# Patient Record
Sex: Female | Born: 1990 | Race: Black or African American | Hispanic: No | Marital: Single | Smoking: Never smoker
Health system: Southern US, Community
[De-identification: ages and names within clinical notes are randomized; demographics above are authoritative.]

## PROBLEM LIST (undated history)

## (undated) DIAGNOSIS — R519 Headache, unspecified: Secondary | ICD-10-CM

## (undated) DIAGNOSIS — D509 Iron deficiency anemia, unspecified: Secondary | ICD-10-CM

## (undated) DIAGNOSIS — I10 Essential (primary) hypertension: Secondary | ICD-10-CM

## (undated) HISTORY — PX: NO PAST SURGERIES: SHX2092

## (undated) HISTORY — DX: Headache, unspecified: R51.9

---

## 2016-09-19 ENCOUNTER — Ambulatory Visit
Admission: EM | Admit: 2016-09-19 | Discharge: 2016-09-19 | Disposition: A | Discharge status: Home / Self Care | Payer: Medicaid Other | Attending: Family Medicine | Admitting: Family Medicine

## 2016-09-19 ENCOUNTER — Ambulatory Visit
Admission: RE | Admit: 2016-09-19 | Discharge: 2016-09-19 | Disposition: A | Discharge status: Home / Self Care | Payer: Medicaid Other | Source: Ambulatory Visit | Attending: Family Medicine | Admitting: Family Medicine

## 2016-09-19 DIAGNOSIS — N898 Other specified noninflammatory disorders of vagina: Secondary | ICD-10-CM | POA: Diagnosis not present

## 2016-09-19 DIAGNOSIS — N760 Acute vaginitis: Secondary | ICD-10-CM | POA: Insufficient documentation

## 2016-09-19 DIAGNOSIS — R109 Unspecified abdominal pain: Secondary | ICD-10-CM | POA: Diagnosis present

## 2016-09-19 DIAGNOSIS — Z3201 Encounter for pregnancy test, result positive: Secondary | ICD-10-CM | POA: Diagnosis present

## 2016-09-19 DIAGNOSIS — B373 Candidiasis of vulva and vagina: Secondary | ICD-10-CM | POA: Insufficient documentation

## 2016-09-19 DIAGNOSIS — R102 Pelvic and perineal pain: Secondary | ICD-10-CM | POA: Insufficient documentation

## 2016-09-19 DIAGNOSIS — N739 Female pelvic inflammatory disease, unspecified: Secondary | ICD-10-CM | POA: Diagnosis not present

## 2016-09-19 DIAGNOSIS — B3731 Acute candidiasis of vulva and vagina: Secondary | ICD-10-CM

## 2016-09-19 LAB — CHLAMYDIA/NGC RT PCR (ARMC ONLY)
Chlamydia Tr: NOT DETECTED
N gonorrhoeae: NOT DETECTED

## 2016-09-19 LAB — WET PREP, GENITAL
Clue Cells Wet Prep HPF POC: NONE SEEN
Sperm: NONE SEEN
Trich, Wet Prep: NONE SEEN

## 2016-09-19 LAB — PREGNANCY, URINE: Preg Test, Ur: POSITIVE — AB

## 2016-09-19 MED ORDER — CEFTRIAXONE SODIUM 1 G IJ SOLR
1.0000 g | Freq: Once | INTRAMUSCULAR | Status: AC
  Administered 2016-09-19: 1 g via INTRAMUSCULAR

## 2016-09-19 MED ORDER — TERCONAZOLE 80 MG VA SUPP: 80.0000 mg | Freq: Every day | VAGINAL | 0 refills | Status: DC

## 2016-09-19 MED ORDER — FLUCONAZOLE 150 MG PO TABS: 150.0000 mg | ORAL_TABLET | Freq: Once | ORAL | 0 refills | Status: DC

## 2016-09-19 MED ORDER — METRONIDAZOLE 500 MG PO TABS: 500.0000 mg | ORAL_TABLET | Freq: Two times a day (BID) | ORAL | 0 refills | Status: DC

## 2016-09-19 MED ORDER — DOXYCYCLINE HYCLATE 100 MG PO CAPS: 100.0000 mg | ORAL_CAPSULE | Freq: Two times a day (BID) | ORAL | 0 refills | Status: DC

## 2016-09-19 NOTE — ED Provider Notes (Addendum)
[facility]    CSN: [id number] Arrival date & time: 09/19/16  [id number]     History   Chief Complaint Chief Complaint  Patient presents with  . Vaginitis    HPI Dawn Skinner is a 26 y.o. female.   Patient is a 26 year old black female with a history discharge. She states everything started Saturday. Start having him be discharged irritation of the vaginal area. She denies E dyspareunia per se but does report irritation which did have sexual relations last time. She states she did not discharged after she had encounter. She's recently had her Implanon removed. No known drug allergies no past surgeries she does not smoke no pertinent past medical history relevant to today's visit both her and her family. She has a yeast infections before was assured the oral medication taken that seemed to make the difference. She's had no pelvic infections or problems with pelvic pain. Offered HIV testing and RPR testing she declines. Because of her concernsbe going on she is requesting to go ahead with pelvic examination as well.   The history is provided by the patient. No language interpreter was used.  Vaginal Discharge  Quality:  Malodorous, white and thick Severity:  Moderate Duration:  3 days Timing:  Constant Progression:  Worsening Chronicity:  New Relieved by:  Nothing Worsened by:  Nothing Ineffective treatments:  None tried Associated symptoms: vaginal itching   Associated symptoms: no abdominal pain, no dyspareunia and no dysuria   Risk factors: unprotected sex     History reviewed. No pertinent past medical history.  There are no active problems to display for this patient.   Past Surgical History:  Procedure Laterality Date  . NO PAST SURGERIES      OB History    No data available       Home Medications    Prior to Admission medications   Medication Sig Start Date End Date Taking? Authorizing Provider  doxycycline (VIBRAMYCIN) 100 MG capsule Take 1  capsule (100 mg total) by mouth 2 (two) times daily. 09/19/16   Hassan RowanWade, Sashia Campas, MD  fluconazole (DIFLUCAN) 150 MG tablet Take 1 tablet (150 mg total) by mouth once. 09/19/16 09/19/16  Hassan RowanWade, Brinda Focht, MD  metroNIDAZOLE (FLAGYL) 500 MG tablet Take 1 tablet (500 mg total) by mouth 2 (two) times daily. 09/19/16   Hassan RowanWade, Dandrea Medders, MD  terconazole (TERAZOL 3) 80 MG vaginal suppository Place 1 suppository (80 mg total) vaginally at bedtime. 09/19/16   Hassan RowanWade, Hymen Arnett, MD    Family History History reviewed. No pertinent family history.  Social History Social History  Substance Use Topics  . Smoking status: Never Smoker  . Smokeless tobacco: Never Used  . Alcohol use No     Allergies   Patient has no known allergies.   Review of Systems Review of Systems  Gastrointestinal: Negative for abdominal pain.  Genitourinary: Positive for vaginal discharge. Negative for dyspareunia and dysuria.  All other systems reviewed and are negative.    Physical Exam Triage Vital Signs ED Triage Vitals  Enc Vitals Group     BP 09/19/16 1026 140/90     Pulse Rate 09/19/16 1026 72     Resp 09/19/16 1026 16     Temp 09/19/16 1026 98.2 F (36.8 C)     Temp Source 09/19/16 1026 Oral     SpO2 09/19/16 1026 99 %     Weight 09/19/16 1023 165 lb (74.8 kg)     Height 09/19/16 1023 5\' 6"  (1.676 m)  Head Circumference --      Peak Flow --      Pain Score 09/19/16 1023 0     Pain Loc --      Pain Edu? --      Excl. in GC? --    No data found.   Updated Vital Signs BP 140/90 (BP Location: Left Arm)   Pulse 72   Temp 98.2 F (36.8 C) (Oral)   Resp 16   Ht 5\' 6"  (1.676 m)   Wt 165 lb (74.8 kg)   LMP 08/07/2016   SpO2 99%   BMI 26.63 kg/m   Visual Acuity Right Eye Distance:   Left Eye Distance:   Bilateral Distance:    Right Eye Near:   Left Eye Near:    Bilateral Near:     Physical Exam  Constitutional: She appears well-developed and well-nourished.  HENT:  Head: Normocephalic and atraumatic.  Right  Ear: External ear normal.  Left Ear: External ear normal.  Mouth/Throat: Oropharynx is clear and moist.  Eyes: Pupils are equal, round, and reactive to light.  Neck: Normal range of motion. Neck supple.  Pulmonary/Chest: Effort normal.  Abdominal: Soft. Hernia confirmed negative in the right inguinal area and confirmed negative in the left inguinal area.  Genitourinary: Rectum normal and uterus normal. Pelvic exam was performed with patient prone. There is no rash or tenderness on the right labia. There is no rash or tenderness on the left labia. Uterus is not deviated. Cervix exhibits friability. Cervix exhibits no motion tenderness and no discharge. Right adnexum displays no mass, no tenderness and no fullness. Left adnexum displays tenderness and fullness. Left adnexum displays no mass. There is erythema and bleeding in the vagina. No tenderness in the vagina. No foreign body in the vagina. No signs of injury around the vagina. Vaginal discharge found.  Genitourinary Comments: Patient has marked tenderness in the left adnexal area and over the left as well. Heavy vaginal discharge also present  Musculoskeletal: Normal range of motion.  Neurological: She is alert.  Skin: Skin is warm.  Psychiatric: She has a normal mood and affect.  Vitals reviewed.    UC Treatments / Results  Labs (all labs ordered are listed, but only abnormal results are displayed) Labs Reviewed  WET PREP, GENITAL - Abnormal; Notable for the following:       Result Value   Yeast Wet Prep HPF POC PRESENT (*)    WBC, Wet Prep HPF POC MANY (*)    All other components within normal limits  CHLAMYDIA/NGC RT PCR ([facility] ONLY)  PREGNANCY, URINE    EKG  EKG Interpretation None       Radiology No results found.  Procedures Procedures (including critical care time)  Medications Ordered in UC Medications  cefTRIAXone (ROCEPHIN) injection 1 g (1 g Intramuscular Given 09/19/16 1125)    Results for orders  placed or performed during the hospital encounter of 09/19/16  Wet prep, genital  Result Value Ref Range   Yeast Wet Prep HPF POC PRESENT (A) NONE SEEN   Trich, Wet Prep NONE SEEN NONE SEEN   Clue Cells Wet Prep HPF POC NONE SEEN NONE SEEN   WBC, Wet Prep HPF POC MANY (A) NONE SEEN   Sperm NONE SEEN    Initial Impression / Assessment and Plan / UC Course  I have reviewed the triage vital signs and the nursing notes.  Pertinent labs & imaging results that were available during my care of the patient  were reviewed by me and considered in my medical decision making (see chart for details).   due to the marked tenderness on the left adnexal area adding diagnosis of PID to diagnosis. Will give a gram Rocephin IM will place on doxycycline 100 mg Flagyl 500 mg twice a day as well for 10 days. Because of the heaviness of the vaginal discharge with no acute Diflucan but also Terazol vaginal suppositories follow-up with her PCP in 3 weeks and repeat pelvic exam also work note given for today and tomorrow as well.    Final Clinical Impressions(s) / UC Diagnoses   Final diagnoses:  Pelvic inflammatory disease, female  Yeast infection of the vagina  Pelvic pain    New Prescriptions New Prescriptions   DOXYCYCLINE (VIBRAMYCIN) 100 MG CAPSULE    Take 1 capsule (100 mg total) by mouth 2 (two) times daily.   FLUCONAZOLE (DIFLUCAN) 150 MG TABLET    Take 1 tablet (150 mg total) by mouth once.   METRONIDAZOLE (FLAGYL) 500 MG TABLET    Take 1 tablet (500 mg total) by mouth 2 (two) times daily.   TERCONAZOLE (TERAZOL 3) 80 MG VAGINAL SUPPOSITORY    Place 1 suppository (80 mg total) vaginally at bedtime.     Note: This dictation was prepared with Dragon dictation along with smaller phrase technology. Any transcriptional errors that result from this process are unintentional.   Hassan Rowan, MD 09/19/16 1207  Results for orders placed or performed during the hospital encounter of 09/19/16  Wet prep,  genital  Result Value Ref Range   Yeast Wet Prep HPF POC PRESENT (A) NONE SEEN   Trich, Wet Prep NONE SEEN NONE SEEN   Clue Cells Wet Prep HPF POC NONE SEEN NONE SEEN   WBC, Wet Prep HPF POC MANY (A) NONE SEEN   Sperm NONE SEEN   Pregnancy, urine  Result Value Ref Range   Preg Test, Ur POSITIVE (A) NEGATIVE    Should be noted this patient without to leave because the urine did not show any other pathogens a urine pregnancy test was obtained in the patient was sugars only she could be pregnant which was strongly positive. At that time the doxycycline and Flagyl was canceled as well as Diflucan we'll leave the Terazol vaginal suppositories for her to use for the yeast vaginitis. With much effort ultrasound of the pelvis was ordered and done through [facility]. The ultrasound did show that she had a intrauterine pregnancy the question was whether it was viable and not since heart tone heart rate could not be well detected. As far as the pain on the left adnexal area she had a corpus luteum cyst which explained to her should resolve during her pregnancy area with those findings were going to recommend a repeat ultrasound in 10-14 days. She can come back to the urgent care and see me on May 17 or the 22nd or go see her PCP for further repeat studies. Copy of the ultrasound will be left at the registration counted.         Hassan Rowan, MD 09/19/16 [phone]

## 2016-09-19 NOTE — ED Triage Notes (Signed)
Patient complains of vaginal itching and white discharge. Patient states that yeast infection started 2 days ago. Patient has not tried any OTC medications for this.

## 2016-09-19 NOTE — Discharge Instructions (Signed)
Due to the heaviness of the infection both Diflucan and Terazol vaginal suppositories have been ordered to treat the yeast infection

## 2016-09-21 ENCOUNTER — Telehealth: Payer: Self-pay

## 2016-09-21 MED ORDER — FLUCONAZOLE 150 MG PO TABS: 150.0000 mg | ORAL_TABLET | Freq: Every day | ORAL | 0 refills | Status: DC

## 2016-09-21 NOTE — Telephone Encounter (Signed)
Called patient and informed her that a prescription for Diflucan has been called into the [facility] in [place], Patient confirmed understanding of information.

## 2016-10-03 ENCOUNTER — Ambulatory Visit
Admit: 2016-10-03 | Discharge: 2016-10-03 | Disposition: A | Discharge status: Home / Self Care | Payer: Medicaid Other | Attending: Family Medicine | Admitting: Family Medicine

## 2016-10-03 ENCOUNTER — Ambulatory Visit
Admission: EM | Admit: 2016-10-03 | Discharge: 2016-10-03 | Disposition: A | Discharge status: Home / Self Care | Payer: Medicaid Other | Attending: Family Medicine | Admitting: Family Medicine

## 2016-10-03 DIAGNOSIS — B9689 Other specified bacterial agents as the cause of diseases classified elsewhere: Secondary | ICD-10-CM | POA: Diagnosis not present

## 2016-10-03 DIAGNOSIS — Z3490 Encounter for supervision of normal pregnancy, unspecified, unspecified trimester: Secondary | ICD-10-CM

## 2016-10-03 DIAGNOSIS — O98811 Other maternal infectious and parasitic diseases complicating pregnancy, first trimester: Secondary | ICD-10-CM | POA: Diagnosis not present

## 2016-10-03 DIAGNOSIS — Z331 Pregnant state, incidental: Secondary | ICD-10-CM | POA: Diagnosis not present

## 2016-10-03 DIAGNOSIS — O23591 Infection of other part of genital tract in pregnancy, first trimester: Secondary | ICD-10-CM | POA: Insufficient documentation

## 2016-10-03 DIAGNOSIS — B373 Candidiasis of vulva and vagina: Secondary | ICD-10-CM | POA: Insufficient documentation

## 2016-10-03 DIAGNOSIS — O3680X1 Pregnancy with inconclusive fetal viability, fetus 1: Secondary | ICD-10-CM

## 2016-10-03 DIAGNOSIS — N760 Acute vaginitis: Secondary | ICD-10-CM | POA: Insufficient documentation

## 2016-10-03 DIAGNOSIS — B3731 Acute candidiasis of vulva and vagina: Secondary | ICD-10-CM

## 2016-10-03 DIAGNOSIS — Z3A08 8 weeks gestation of pregnancy: Secondary | ICD-10-CM | POA: Diagnosis not present

## 2016-10-03 DIAGNOSIS — R110 Nausea: Secondary | ICD-10-CM | POA: Insufficient documentation

## 2016-10-03 DIAGNOSIS — O26891 Other specified pregnancy related conditions, first trimester: Secondary | ICD-10-CM | POA: Insufficient documentation

## 2016-10-03 LAB — WET PREP, GENITAL
Sperm: NONE SEEN
Trich, Wet Prep: NONE SEEN
Yeast Wet Prep HPF POC: NONE SEEN

## 2016-10-03 LAB — CHLAMYDIA/NGC RT PCR (ARMC ONLY)
Chlamydia Tr: NOT DETECTED
N gonorrhoeae: NOT DETECTED

## 2016-10-03 MED ORDER — ONDANSETRON 4 MG PO TBDP
4.0000 mg | ORAL_TABLET | Freq: Once | ORAL | Status: AC
  Administered 2016-10-03: 4 mg via ORAL

## 2016-10-03 MED ORDER — TERCONAZOLE 80 MG VA SUPP: 80.0000 mg | Freq: Every day | VAGINAL | 0 refills | Status: DC

## 2016-10-03 MED ORDER — TOPI-CLICK PERL VAGINAL DOSING MISC: 1.0000 "application " | Freq: Every day | 0 refills | Status: DC

## 2016-10-03 MED ORDER — ONDANSETRON 8 MG PO TBDP: 8.0000 mg | ORAL_TABLET | Freq: Three times a day (TID) | ORAL | 0 refills | Status: DC | PRN

## 2016-10-03 MED ORDER — PRENATAL ONE DAILY 27-0.8 MG PO TABS: 1.0000 | ORAL_TABLET | Freq: Every day | ORAL | 0 refills | Status: DC

## 2016-10-03 MED ORDER — ONDANSETRON HCL 4 MG/2ML IJ SOLN: 8.0000 mg | Freq: Once | INTRAMUSCULAR | Status: DC

## 2016-10-03 NOTE — ED Triage Notes (Signed)
Patient complains of nausea and vomiting. Patient states that she is no longer in any lower abdominal pain. Patient states she is here for repeat us

## 2016-10-03 NOTE — ED Provider Notes (Signed)
[facility]    CSN: [id number] Arrival date & time: 10/03/16  1001     History   Chief Complaint Chief Complaint  Patient presents with  . Nausea    HPI Dawn Skinner is a 26 y.o. female.   Patient is a 26 year old black female is here to evaluate and reestablish pregnancy status. She had ultrasound about 10 days ago that showed copious luteum cyst and a uterine embryo without detecting good cardiac activity. She is about [redacted] weeks pregnant and was recommended that she come back we see PCP in 10-14 days for repeat ultrasound. Since then she also reports that the yeast infection that we're treating with Terazol vaginal suppositories has not got better fairly so called in some Diflucan explained to her that I proceeded not want to be on Diflucan since was present. Apparently there was a mixup she says with her Medicaid Medicaid wouldn't pay for her Radene Ou because they cannot confirm that she had Medicaid. She's also had nausea since then as well.    The history is provided by the patient. No language interpreter was used.  Vaginal Discharge  Quality:  Malodorous and white Severity:  Moderate Timing:  Constant Progression:  Worsening Chronicity:  New Relieved by:  Nothing Associated symptoms: nausea and vomiting   Associated symptoms: no abdominal pain   Possible Pregnancy  Pertinent negatives include no abdominal pain.    History reviewed. No pertinent past medical history.  There are no active problems to display for this patient.   Past Surgical History:  Procedure Laterality Date  . NO PAST SURGERIES      OB History    Gravida Para Term Preterm AB Living   1             SAB TAB Ectopic Multiple Live Births                   Home Medications    Prior to Admission medications   Medication Sig Start Date End Date Taking? Authorizing Provider  fluconazole (DIFLUCAN) 150 MG tablet Take 1 tablet (150 mg total) by mouth daily. 09/21/16   Lutricia Feil, PA-C  Misc. Devices (TOPI-CLICK PERL VAGINAL DOSING) MISC Place 1 application vaginally at bedtime. For 7 nights 10/03/16   Hassan Rowan, MD  ondansetron (ZOFRAN ODT) 8 MG disintegrating tablet Take 1 tablet (8 mg total) by mouth every 8 (eight) hours as needed for nausea or vomiting. 10/03/16   Hassan Rowan, MD  Prenatal Vit-Fe Fumarate-FA (PRENATAL ONE DAILY) 27-0.8 MG TABS Take 1 tablet by mouth daily. 10/03/16   Hassan Rowan, MD  terconazole (TERAZOL 3) 80 MG vaginal suppository Place 1 suppository (80 mg total) vaginally at bedtime. Use after the clindamycin 10/03/16   Hassan Rowan, MD    Family History History reviewed. No pertinent family history.  Social History Social History  Substance Use Topics  . Smoking status: Never Smoker  . Smokeless tobacco: Never Used  . Alcohol use No     Allergies   Patient has no known allergies.   Review of Systems Review of Systems  Gastrointestinal: Positive for nausea and vomiting. Negative for abdominal distention and abdominal pain.  Genitourinary: Positive for vaginal discharge.  All other systems reviewed and are negative.    Physical Exam Triage Vital Signs ED Triage Vitals [10/03/16 1043]  Enc Vitals Group     BP 117/62     Pulse Rate 96     Resp 16  Temp 98 F (36.7 C)     Temp Source Oral     SpO2 98 %     Weight 165 lb (74.8 kg)     Height 5\' 6"  (1.676 m)     Head Circumference      Peak Flow      Pain Score      Pain Loc      Pain Edu?      Excl. in GC?    No data found.   Updated Vital Signs BP 117/62 (BP Location: Left Arm)   Pulse 96   Temp 98 F (36.7 C) (Oral)   Resp 16   Ht 5\' 6"  (1.676 m)   Wt 165 lb (74.8 kg)   LMP 08/07/2016   SpO2 98%   BMI 26.63 kg/m   Visual Acuity Right Eye Distance:   Left Eye Distance:   Bilateral Distance:    Right Eye Near:   Left Eye Near:    Bilateral Near:     Physical Exam  Constitutional: She appears well-developed and well-nourished.    HENT:  Head: Normocephalic and atraumatic.  Right Ear: External ear normal.  Left Ear: External ear normal.  Eyes: Pupils are equal, round, and reactive to light.  Neck: Normal range of motion. Neck supple.  Pulmonary/Chest: Effort normal.  Abdominal: Soft. Bowel sounds are normal. Hernia confirmed negative in the right inguinal area and confirmed negative in the left inguinal area.  Genitourinary: Rectum normal and uterus normal. Rectal exam shows no external hemorrhoid, no internal hemorrhoid and no fissure. Pelvic exam was performed with patient prone. There is no rash, tenderness, lesion or injury on the right labia. There is no rash, tenderness, lesion or injury on the left labia. Right adnexum displays no mass and no fullness. Left adnexum displays no mass, no tenderness and no fullness. No tenderness in the vagina. Vaginal discharge found.  Genitourinary Comments: Heavy discharge present GC and chlamydia test and wet prep obtained. No uterine or adnexal tenderness palpated today like we did 10 days ago.  Lymphadenopathy:       Right: No inguinal adenopathy present.  Vitals reviewed.    UC Treatments / Results  Labs (all labs ordered are listed, but only abnormal results are displayed) Labs Reviewed  WET PREP, GENITAL - Abnormal; Notable for the following:       Result Value   Clue Cells Wet Prep HPF POC PRESENT (*)    WBC, Wet Prep HPF POC FEW (*)    All other components within normal limits  CHLAMYDIA/NGC RT PCR ([facility] ONLY)    EKG  EKG Interpretation None       Radiology [place] Ob Comp Less 14 Wks  Result Date: 10/03/2016 CLINICAL DATA:  First trimester pregnancy, uncertain viability on prior study ; EGA [redacted] weeks 5 days by first ultrasound EXAM: OBSTETRIC <14 WK US AND TRANSVAGINAL OB US TECHNIQUE: Both transabdominal and transvaginal ultrasound examinations were performed for complete evaluation of the gestation as well as the maternal uterus, adnexal regions,  and pelvic cul-de-sac. Transvaginal technique was performed to assess early pregnancy. COMPARISON:  09/19/2016 FINDINGS: Intrauterine gestational sac: Present Yolk sac:  Present Embryo:  Present Cardiac Activity: Present Heart Rate: 158  bpm CRL:  15.7  mm   8 w   0 d                  US EDC: 05/15/2017 Subchorionic hemorrhage:  Small subchronic hemorrhage Maternal uterus/adnexae: RIGHT ovary normal  size and morphology, 2.7 x 3.0 x 1.2 cm LEFT ovary measures 5.0 x 3.4 x 2.9 cm and contains a corpus luteal cyst. No additional adnexal masses. Trace free pelvic fluid. IMPRESSION: Single live intrauterine gestation as above. Small subchronic hemorrhage. Electronically Signed   By: Ulyses Southward M.D.   On: 10/03/2016 12:09   US Ob Transvaginal  Result Date: 10/03/2016 CLINICAL DATA:  First trimester pregnancy, uncertain viability on prior study ; EGA [redacted] weeks 5 days by first ultrasound EXAM: OBSTETRIC <14 WK [place] AND TRANSVAGINAL OB US TECHNIQUE: Both transabdominal and transvaginal ultrasound examinations were performed for complete evaluation of the gestation as well as the maternal uterus, adnexal regions, and pelvic cul-de-sac. Transvaginal technique was performed to assess early pregnancy. COMPARISON:  09/19/2016 FINDINGS: Intrauterine gestational sac: Present Yolk sac:  Present Embryo:  Present Cardiac Activity: Present Heart Rate: 158  bpm CRL:  15.7  mm   8 w   0 d                  [place] EDC: 05/15/2017 Subchorionic hemorrhage:  Small subchronic hemorrhage Maternal uterus/adnexae: RIGHT ovary normal size and morphology, 2.7 x 3.0 x 1.2 cm LEFT ovary measures 5.0 x 3.4 x 2.9 cm and contains a corpus luteal cyst. No additional adnexal masses. Trace free pelvic fluid. IMPRESSION: Single live intrauterine gestation as above. Small subchronic hemorrhage. Electronically Signed   By: Ulyses Southward M.D.   On: 10/03/2016 12:09    Procedures Procedures (including critical care time)  Medications Ordered in UC Medications   ondansetron (ZOFRAN-ODT) disintegrating tablet 4 mg (4 mg Oral Given 10/03/16 1109)     Initial Impression / Assessment and Plan / UC Course  I have reviewed the triage vital signs and the nursing notes.  Pertinent labs & imaging results that were available during my care of the patient were reviewed by me and considered in my medical decision making (see chart for details).     We'll get ultrasound of the uterine area uterus to evaluate for fetal viability. 8 mg Zofran given sublingual because of the nausea  Final Clinical Impressions(s) / UC Diagnoses   Final diagnoses:  Normal intrauterine pregnancy on prenatal ultrasound, antepartum  Nausea  Bacterial vaginal infection  Vaginal yeast infection    New Prescriptions New Prescriptions   MISC. DEVICES (TOPI-CLICK PERL VAGINAL DOSING) MISC    Place 1 application vaginally at bedtime. For 7 nights   ONDANSETRON (ZOFRAN ODT) 8 MG DISINTEGRATING TABLET    Take 1 tablet (8 mg total) by mouth every 8 (eight) hours as needed for nausea or vomiting.   PRENATAL VIT-FE FUMARATE-FA (PRENATAL ONE DAILY) 27-0.8 MG TABS    Take 1 tablet by mouth daily.   Patient with a intrauterine pregnancy small hemorrhage present. She also has now bacterial vaginosis history of yeast infection treated with Diflucan tablet that did not get better and also need for prenatal vitamins and prenatal care. Will refer back to PCP to get approval for OB/GYN if her [place] access allows her to go see OB directly she may do that as well. Recommend clindamycin vaginal cream initially for the first 7 days and then use the Terazol afterwards. Zofran for nausea if needed and prenatal vitamins started        Hassan Rowan, MD 10/03/16 1237

## 2016-10-04 ENCOUNTER — Telehealth: Payer: Self-pay

## 2016-10-04 NOTE — Telephone Encounter (Signed)
-----   Message from Hassan RowanEugene Wade, MD sent at 10/03/2016  6:46 PM EDT ----- Please notify patient that her chlamydia and GC tests were both negative.

## 2016-10-04 NOTE — Telephone Encounter (Signed)
Called pt to inform her of test results, message on phone says that the person cannot accept phone calls at this time. I will try again later.

## 2016-10-14 ENCOUNTER — Emergency Department
Admission: EM | Admit: 2016-10-14 | Discharge: 2016-10-14 | Disposition: A | Discharge status: Home / Self Care | Payer: Medicaid Other | Attending: Emergency Medicine | Admitting: Emergency Medicine

## 2016-10-14 ENCOUNTER — Encounter: Payer: Self-pay | Admitting: Emergency Medicine

## 2016-10-14 DIAGNOSIS — N9489 Other specified conditions associated with female genital organs and menstrual cycle: Secondary | ICD-10-CM | POA: Diagnosis not present

## 2016-10-14 DIAGNOSIS — O210 Mild hyperemesis gravidarum: Secondary | ICD-10-CM | POA: Insufficient documentation

## 2016-10-14 DIAGNOSIS — Z3A09 9 weeks gestation of pregnancy: Secondary | ICD-10-CM | POA: Insufficient documentation

## 2016-10-14 DIAGNOSIS — O209 Hemorrhage in early pregnancy, unspecified: Secondary | ICD-10-CM | POA: Insufficient documentation

## 2016-10-14 LAB — URINALYSIS, COMPLETE (UACMP) WITH MICROSCOPIC
Bacteria, UA: NONE SEEN
Bilirubin Urine: NEGATIVE
Glucose, UA: NEGATIVE mg/dL
Hgb urine dipstick: NEGATIVE
Ketones, ur: 80 mg/dL — AB
Nitrite: NEGATIVE
Protein, ur: 30 mg/dL — AB
Specific Gravity, Urine: 1.028 (ref 1.005–1.030)
pH: 6 (ref 5.0–8.0)

## 2016-10-14 LAB — CBC
HCT: 39.2 % (ref 35.0–47.0)
Hemoglobin: 13.1 g/dL (ref 12.0–16.0)
MCH: 27.3 pg (ref 26.0–34.0)
MCHC: 33.3 g/dL (ref 32.0–36.0)
MCV: 81.8 fL (ref 80.0–100.0)
Platelets: 244 10*3/uL (ref 150–440)
RBC: 4.79 MIL/uL (ref 3.80–5.20)
RDW: 14.8 % — ABNORMAL HIGH (ref 11.5–14.5)
WBC: 12 10*3/uL — ABNORMAL HIGH (ref 3.6–11.0)

## 2016-10-14 LAB — COMPREHENSIVE METABOLIC PANEL
ALT: 9 U/L — ABNORMAL LOW (ref 14–54)
AST: 16 U/L (ref 15–41)
Albumin: 4.2 g/dL (ref 3.5–5.0)
Alkaline Phosphatase: 36 U/L — ABNORMAL LOW (ref 38–126)
Anion gap: 9 (ref 5–15)
BUN: 9 mg/dL (ref 6–20)
CO2: 23 mmol/L (ref 22–32)
Calcium: 9.3 mg/dL (ref 8.9–10.3)
Chloride: 101 mmol/L (ref 101–111)
Creatinine, Ser: 0.62 mg/dL (ref 0.44–1.00)
GFR calc Af Amer: >60 mL/min (ref >60)
GFR calc non Af Amer: >60 mL/min (ref >60)
Glucose, Bld: 94 mg/dL (ref 65–99)
Potassium: 3.6 mmol/L (ref 3.5–5.1)
Sodium: 133 mmol/L — ABNORMAL LOW (ref 135–145)
Total Bilirubin: 1 mg/dL (ref 0.3–1.2)
Total Protein: 7.6 g/dL (ref 6.5–8.1)

## 2016-10-14 LAB — LIPASE, BLOOD: Lipase: 22 U/L (ref 11–51)

## 2016-10-14 LAB — HCG, QUANTITATIVE, PREGNANCY: hCG, Beta Chain, Quant, S: 140065 m[IU]/mL — ABNORMAL HIGH (ref <5)

## 2016-10-14 MED ORDER — LACTATED RINGERS IV BOLUS (SEPSIS)
1000.0000 mL | Freq: Once | INTRAVENOUS | Status: AC
  Administered 2016-10-14: 1000 mL via INTRAVENOUS
  Filled 2016-10-14: qty 1000

## 2016-10-14 MED ORDER — METOCLOPRAMIDE HCL 5 MG/ML IJ SOLN
10.0000 mg | Freq: Once | INTRAMUSCULAR | Status: AC
  Administered 2016-10-14: 10 mg via INTRAVENOUS
  Filled 2016-10-14: qty 2

## 2016-10-14 MED ORDER — SODIUM CHLORIDE 0.9 % IV BOLUS (SEPSIS)
1000.0000 mL | Freq: Once | INTRAVENOUS | Status: AC
  Administered 2016-10-14: 1000 mL via INTRAVENOUS

## 2016-10-14 MED ORDER — DOXYLAMINE-PYRIDOXINE 10-10 MG PO TBEC: DELAYED_RELEASE_TABLET | ORAL | 1 refills | Status: DC

## 2016-10-14 NOTE — ED Notes (Signed)
Pt given apple juice for PO challenge.

## 2016-10-14 NOTE — ED Notes (Signed)
Patient reports she is [redacted] weeks pregnant. Pt c/o n/v throughout pregnancy. Pt reports increased vomiting X 2 days and intermittent LLQ abdominal pain rated at 5 out of 1o described as sharp.  Patient is tender to palpation of LLQ. Patient reports 10+ emeses in the last 24 hours, with near constant dry heaving. Pt denies diarrhea.  Patient c/o light vaginal bleeding X 2 days. Patient denies vaginal discharge. Pt's last intercourse 1 week ago.

## 2016-10-14 NOTE — ED Notes (Signed)
20G removed from patient LAC with site being clean, dry, and intact.

## 2016-10-14 NOTE — ED Triage Notes (Signed)
Patient presents to the ED with nausea and vomiting for the past several days.  Patient states she hasn't been able to keep any food or liquid down for several days and patient reports today she noted spots of bright red blood in her emesis.  Patient is approx. [redacted] weeks pregnant.  She stated that her OB is aware of her symptoms and prescribed patient zofran but patient states it is not working.  Patient appears tired and uncomfortable during triage.

## 2016-10-14 NOTE — ED Notes (Signed)
Pt states unable to give urine sample at this time 

## 2016-10-14 NOTE — ED Provider Notes (Addendum)
[facility] Emergency Department Provider Note  ____________________________________________   I have reviewed the triage vital signs and the nursing notes.   HISTORY  Chief Complaint Emesis During Pregnancy and Hematemesis    HPI Dawn Skinner is a 26 y.o. female who presents today with vomiting of pregnancy. She's been vomiting for 2-3 weeks. She states that she had trace of blood in her emesis. She denies any melena or bright red blood per rectum. She states that she has benign with hyperemesis during his emergency. She is currently 9 weeks pertinent. She is G2 P1. She states that she did have some vomiting with her prior pregnancy but not as much pain she has Zofran at home which was prescribed by a provider and prenatal vitamins but they have not helped her. She denies any fever or abdominal pain no vaginal bleeding or gush of fluid. No contractions. Patient did having normal first trimester pregnancy EGA [redacted] weeks 5 days on May 22 by ultrasound.   She states nothing makes it better nothing makes it worse, she denies headache or fever. She has no significant hematemesis.   History reviewed. No pertinent past medical history.  There are no active problems to display for this patient.   Past Surgical History:  Procedure Laterality Date  . NO PAST SURGERIES      Prior to Admission medications   Medication Sig Start Date End Date Taking? Authorizing Provider  fluconazole (DIFLUCAN) 150 MG tablet Take 1 tablet (150 mg total) by mouth daily. 09/21/16   Lutricia Feiloemer, William P, PA-C  Misc. Devices (TOPI-CLICK PERL VAGINAL DOSING) MISC Place 1 application vaginally at bedtime. For 7 nights 10/03/16   Hassan RowanWade, Eugene, MD  ondansetron (ZOFRAN ODT) 8 MG disintegrating tablet Take 1 tablet (8 mg total) by mouth every 8 (eight) hours as needed for nausea or vomiting. 10/03/16   Hassan RowanWade, Eugene, MD  Prenatal Vit-Fe Fumarate-FA (PRENATAL ONE DAILY) 27-0.8 MG TABS Take 1 tablet by  mouth daily. 10/03/16   Hassan RowanWade, Eugene, MD  terconazole (TERAZOL 3) 80 MG vaginal suppository Place 1 suppository (80 mg total) vaginally at bedtime. Use after the clindamycin 10/03/16   Hassan RowanWade, Eugene, MD    Allergies Patient has no known allergies.  No family history on file.  Social History Social History  Substance Use Topics  . Smoking status: Never Smoker  . Smokeless tobacco: Never Used  . Alcohol use No    Review of Systems Constitutional: No fever/chills Eyes: No visual changes. ENT: No sore throat. No stiff neck no neck pain Cardiovascular: Denies chest pain. Respiratory: Denies shortness of breath. Gastrointestinal:   Positive vomiting.  No diarrhea.  No constipation. Genitourinary: Negative for dysuria. Musculoskeletal: Negative lower extremity swelling Skin: Negative for rash. Neurological: Negative for severe headaches, focal weakness or numbness.   ____________________________________________   PHYSICAL EXAM:  VITAL SIGNS: ED Triage Vitals  Enc Vitals Group     BP 10/14/16 1536 (!) 141/88     Pulse Rate 10/14/16 1536 (!) 101     Resp 10/14/16 1536 16     Temp 10/14/16 1536 98.8 F (37.1 C)     Temp Source 10/14/16 1536 Oral     SpO2 10/14/16 1536 99 %     Weight 10/14/16 1537 165 lb (74.8 kg)     Height 10/14/16 1537 5\' 6"  (1.676 m)     Head Circumference --      Peak Flow --      Pain Score 10/14/16 1536 0  Pain Loc --      Pain Edu? --      Excl. in GC? --     Constitutional: Alert and oriented. Well appearing and in no acute distress. Eyes: Conjunctivae are normal Head: Atraumatic HEENT: No congestion/rhinnorhea. Mucous membranes are moist.  Oropharynx non-erythematous Neck:   Nontender with no meningismus, no masses, no stridor Cardiovascular: Normal rate, regular rhythm. Grossly normal heart sounds.  Good peripheral circulation. Respiratory: Normal respiratory effort.  No retractions. Lungs CTAB. Abdominal: Soft and nontender. No  distention. No guarding no rebound Back:  There is no focal tenderness or step off.  there is no midline tenderness there are no lesions noted. there is no CVA tenderness Musculoskeletal: No lower extremity tenderness, no upper extremity tenderness. No joint effusions, no DVT signs strong distal pulses no edema Neurologic:  Normal speech and language. No gross focal neurologic deficits are appreciated.  Skin:  Skin is warm, dry and intact. No rash noted. Psychiatric: Mood and affect are normal. Speech and behavior are normal.  ____________________________________________   LABS (all labs ordered are listed, but only abnormal results are displayed)  Labs Reviewed  COMPREHENSIVE METABOLIC PANEL - Abnormal; Notable for the following:       Result Value   Sodium 133 (*)    ALT 9 (*)    Alkaline Phosphatase 36 (*)    All other components within normal limits  CBC - Abnormal; Notable for the following:    WBC 12.0 (*)    RDW 14.8 (*)    All other components within normal limits  HCG, QUANTITATIVE, PREGNANCY - Abnormal; Notable for the following:    hCG, Beta Chain, Quant, S 140,065 (*)    All other components within normal limits  LIPASE, BLOOD  URINALYSIS, COMPLETE (UACMP) WITH MICROSCOPIC   ____________________________________________  EKG  I personally interpreted any EKGs ordered by me or triage  ____________________________________________  RADIOLOGY  I reviewed any imaging ordered by me or triage that were performed during my shift and, if possible, patient and/or family made aware of any abnormal findings. ____________________________________________   PROCEDURES  Procedure(s) performed: None  Procedures  Critical Care performed: None  ____________________________________________   INITIAL IMPRESSION / ASSESSMENT AND PLAN / ED COURSE  Pertinent labs & imaging results that were available during my care of the patient were reviewed by me and considered in my  medical decision making (see chart for details).  She is well appearing, we are giving her IV fluids we will give her LR, we'll try sending her home with diclegis, patient does not seem to have any evidence of a significant GI bleed. Complaints of vaginal bleeding or discharge or abdominal pain. Nothing to suggest gallbladder disease or appendicitis. Renal function is preserved.  ----------------------------------------- 7:42 PM on 10/14/2016 -----------------------------------------  Patient feeling much better after IV fluid blood work reassuring, we will wait for the second liter is in and try by mouth challenge.    ____________________________________________   FINAL CLINICAL IMPRESSION(S) / ED DIAGNOSES  Final diagnoses:  None      This chart was dictated using voice recognition software.  Despite best efforts to proofread,  errors can occur which can change meaning.      Jeanmarie Plant, MD 10/14/16 1827    Jeanmarie Plant, MD 10/14/16 [phone]

## 2017-04-27 ENCOUNTER — Emergency Department
Admission: EM | Admit: 2017-04-27 | Discharge: 2017-04-27 | Disposition: A | Discharge status: Home / Self Care | Payer: Medicaid Other | Attending: Student in an Organized Health Care Education/Training Program | Admitting: Student in an Organized Health Care Education/Training Program

## 2017-04-27 ENCOUNTER — Encounter: Payer: Self-pay | Admitting: Emergency Medicine

## 2017-04-27 ENCOUNTER — Other Ambulatory Visit: Payer: Self-pay

## 2017-04-27 DIAGNOSIS — E860 Dehydration: Secondary | ICD-10-CM | POA: Diagnosis not present

## 2017-04-27 DIAGNOSIS — R112 Nausea with vomiting, unspecified: Secondary | ICD-10-CM | POA: Diagnosis present

## 2017-04-27 DIAGNOSIS — Z3A00 Weeks of gestation of pregnancy not specified: Secondary | ICD-10-CM | POA: Insufficient documentation

## 2017-04-27 DIAGNOSIS — Z79899 Other long term (current) drug therapy: Secondary | ICD-10-CM | POA: Insufficient documentation

## 2017-04-27 DIAGNOSIS — O210 Mild hyperemesis gravidarum: Secondary | ICD-10-CM

## 2017-04-27 LAB — HCG, QUANTITATIVE, PREGNANCY: hCG, Beta Chain, Quant, S: 209125 m[IU]/mL — ABNORMAL HIGH (ref <5)

## 2017-04-27 LAB — URINALYSIS, COMPLETE (UACMP) WITH MICROSCOPIC
Bacteria, UA: NONE SEEN
Bilirubin Urine: NEGATIVE
Glucose, UA: NEGATIVE mg/dL
Hgb urine dipstick: NEGATIVE
Ketones, ur: 80 mg/dL — AB
Leukocytes, UA: NEGATIVE
Nitrite: NEGATIVE
Protein, ur: 100 mg/dL — AB
Specific Gravity, Urine: 1.033 — ABNORMAL HIGH (ref 1.005–1.030)
pH: 5 (ref 5.0–8.0)

## 2017-04-27 LAB — CBC WITH DIFFERENTIAL/PLATELET
Basophils Absolute: 0.1 10*3/uL (ref 0–0.1)
Basophils Relative: 0 %
Eosinophils Absolute: 0 10*3/uL (ref 0–0.7)
Eosinophils Relative: 0 %
HCT: 42.7 % (ref 35.0–47.0)
Hemoglobin: 14 g/dL (ref 12.0–16.0)
Lymphocytes Relative: 12 %
Lymphs Abs: 1.6 10*3/uL (ref 1.0–3.6)
MCH: 26.2 pg (ref 26.0–34.0)
MCHC: 32.7 g/dL (ref 32.0–36.0)
MCV: 80.1 fL (ref 80.0–100.0)
Monocytes Absolute: 1 10*3/uL — ABNORMAL HIGH (ref 0.2–0.9)
Monocytes Relative: 7 %
Neutro Abs: 11 10*3/uL — ABNORMAL HIGH (ref 1.4–6.5)
Neutrophils Relative %: 81 %
Platelets: 219 10*3/uL (ref 150–440)
RBC: 5.33 MIL/uL — ABNORMAL HIGH (ref 3.80–5.20)
RDW: 16.2 % — ABNORMAL HIGH (ref 11.5–14.5)
WBC: 13.6 10*3/uL — ABNORMAL HIGH (ref 3.6–11.0)

## 2017-04-27 LAB — COMPREHENSIVE METABOLIC PANEL
ALT: 11 U/L — ABNORMAL LOW (ref 14–54)
AST: 21 U/L (ref 15–41)
Albumin: 4.5 g/dL (ref 3.5–5.0)
Alkaline Phosphatase: 44 U/L (ref 38–126)
Anion gap: 16 — ABNORMAL HIGH (ref 5–15)
BUN: 17 mg/dL (ref 6–20)
CO2: 21 mmol/L — ABNORMAL LOW (ref 22–32)
Calcium: 9.5 mg/dL (ref 8.9–10.3)
Chloride: 93 mmol/L — ABNORMAL LOW (ref 101–111)
Creatinine, Ser: 0.76 mg/dL (ref 0.44–1.00)
GFR calc Af Amer: >60 mL/min (ref >60)
GFR calc non Af Amer: >60 mL/min (ref >60)
Glucose, Bld: 90 mg/dL (ref 65–99)
Potassium: 3 mmol/L — ABNORMAL LOW (ref 3.5–5.1)
Sodium: 130 mmol/L — ABNORMAL LOW (ref 135–145)
Total Bilirubin: 1.3 mg/dL — ABNORMAL HIGH (ref 0.3–1.2)
Total Protein: 8.3 g/dL — ABNORMAL HIGH (ref 6.5–8.1)

## 2017-04-27 LAB — LIPASE, BLOOD: Lipase: 22 U/L (ref 11–51)

## 2017-04-27 MED ORDER — METOCLOPRAMIDE HCL 10 MG PO TABS: 10.0000 mg | ORAL_TABLET | Freq: Four times a day (QID) | ORAL | 0 refills | Status: DC | PRN

## 2017-04-27 MED ORDER — DOXYLAMINE-PYRIDOXINE 10-10 MG PO TBEC: 1.0000 | DELAYED_RELEASE_TABLET | Freq: Two times a day (BID) | ORAL | 0 refills | Status: DC

## 2017-04-27 MED ORDER — VITAMIN B-6 25 MG PO TABS
12.5000 mg | ORAL_TABLET | Freq: Every day | ORAL | Status: DC
  Filled 2017-04-27: qty 1

## 2017-04-27 MED ORDER — SODIUM CHLORIDE 0.9 % IV BOLUS (SEPSIS)
1000.0000 mL | Freq: Once | INTRAVENOUS | Status: AC
  Administered 2017-04-27: 1000 mL via INTRAVENOUS

## 2017-04-27 MED ORDER — DEXTROSE-NACL 5-0.45 % IV SOLN
1000.0000 mL | Freq: Once | INTRAVENOUS | Status: AC
  Administered 2017-04-27: 1000 mL via INTRAVENOUS

## 2017-04-27 MED ORDER — METOCLOPRAMIDE HCL 5 MG/ML IJ SOLN
10.0000 mg | Freq: Once | INTRAMUSCULAR | Status: AC
  Administered 2017-04-27: 10 mg via INTRAVENOUS
  Filled 2017-04-27: qty 2

## 2017-04-27 MED ORDER — POTASSIUM CHLORIDE CRYS ER 20 MEQ PO TBCR
40.0000 meq | EXTENDED_RELEASE_TABLET | Freq: Once | ORAL | Status: AC
  Administered 2017-04-27: 40 meq via ORAL
  Filled 2017-04-27: qty 2

## 2017-04-27 MED ORDER — DOXYLAMINE SUCCINATE (SLEEP) 25 MG PO TABS
12.5000 mg | ORAL_TABLET | Freq: Once | ORAL | Status: DC
  Filled 2017-04-27: qty 1

## 2017-04-27 MED ORDER — VITAMIN B-6 50 MG PO TABS
25.0000 mg | ORAL_TABLET | Freq: Every day | ORAL | Status: DC
  Filled 2017-04-27: qty 0.5

## 2017-04-27 NOTE — ED Notes (Signed)

## 2017-04-27 NOTE — ED Notes (Signed)
Pt refused swabs to this RN at this time. Fluids almost completed; pt aware that she is discharged as soon as the fluids are done. Next RN Revonda StandardAllison aware.

## 2017-04-27 NOTE — ED Triage Notes (Addendum)
Arrives with c/o N/V x 2 weeks.  States has taken two home pregnancy tests, both were positive.  Denies abdominal pain or vaginal bleeding.  LMP:  02/2017.  G2 P2.

## 2017-04-27 NOTE — ED Provider Notes (Signed)
[facility] Emergency Department Provider Note    First MD Initiated Contact with Patient 04/27/17 1013     (approximate)  I have reviewed the triage vital signs and the nursing notes.   HISTORY  Chief Complaint Emesis    HPI Dawn Skinner is a 26 y.o. female G3P2 presents with 2 weeks of persistent nausea vomiting and dehydration.  States that even drinking juice or water causes her to feel nauseated and she vomits.  Denies any fevers.  Has had similar episodes of hyperemesis with her previous pregnancy.  Is not tried any medications to help with this at home.  Denies any abdominal pain.  No vaginal discharge.  No vaginal pain.  No vaginal bleeding.  History reviewed. No pertinent past medical history. No family history on file. Past Surgical History:  Procedure Laterality Date  . NO PAST SURGERIES     There are no active problems to display for this patient.     Prior to Admission medications   Medication Sig Start Date End Date Taking? Authorizing Provider  Doxylamine-Pyridoxine (DICLEGIS) 10-10 MG TBEC 2 tabs orally at hs (Day 1). If this works, continue taking 2 tab qhs. However, if nausea persists Day 2, take 2 tabs at bedtime that night then take three tablets starting on Day 3 (one tablet in the morning and two tablets at bedtime). If these 3 tablets control symptoms on Day 4, continue taking 3 tabs daily. Otherwise take 4 tabs starting on Day 4 (one tablet in the morning, one tablet mid-afternoon and two tablets at bedtime). 10/14/16   Jeanmarie Plant, MD  fluconazole (DIFLUCAN) 150 MG tablet Take 1 tablet (150 mg total) by mouth daily. Patient not taking: Reported on 10/14/2016 09/21/16   Lutricia Feil, PA-C  Misc. Devices (TOPI-CLICK PERL VAGINAL DOSING) MISC Place 1 application vaginally at bedtime. For 7 nights Patient not taking: Reported on 10/14/2016 10/03/16   Hassan Rowan, MD  ondansetron (ZOFRAN ODT) 8 MG disintegrating tablet Take 1  tablet (8 mg total) by mouth every 8 (eight) hours as needed for nausea or vomiting. Patient not taking: Reported on 10/14/2016 10/03/16   Hassan Rowan, MD  Prenatal Vit-Fe Fumarate-FA (PRENATAL ONE DAILY) 27-0.8 MG TABS Take 1 tablet by mouth daily. 10/03/16   Hassan Rowan, MD  terconazole (TERAZOL 3) 80 MG vaginal suppository Place 1 suppository (80 mg total) vaginally at bedtime. Use after the clindamycin Patient not taking: Reported on 10/14/2016 10/03/16   Hassan Rowan, MD    Allergies Patient has no known allergies.    Social History Social History   Tobacco Use  . Smoking status: Never Smoker  . Smokeless tobacco: Never Used  Substance Use Topics  . Alcohol use: No  . Drug use: No    Review of Systems Patient denies headaches, rhinorrhea, blurry vision, numbness, shortness of breath, chest pain, edema, cough, abdominal pain, nausea, vomiting, diarrhea, dysuria, fevers, rashes or hallucinations unless otherwise stated above in HPI. ____________________________________________   PHYSICAL EXAM:  VITAL SIGNS: Vitals:   04/27/17 1330 04/27/17 1420  BP: 130/89 (!) 132/96  Pulse:  98  Resp:  16  Temp:    SpO2:  100%    Constitutional: Alert and oriented. Well appearing and in no acute distress. Eyes: Conjunctivae are normal.  Head: Atraumatic. Nose: No congestion/rhinnorhea. Mouth/Throat: Mucous membranes are moist.   Neck: No stridor. Painless ROM.  Cardiovascular: Normal rate, regular rhythm. Grossly normal heart sounds.  Good peripheral circulation. Respiratory: Normal respiratory  effort.  No retractions. Lungs CTAB. Gastrointestinal: Soft and nontender. No distention. No abdominal bruits. No CVA tenderness. Genitourinary:  Musculoskeletal: No lower extremity tenderness nor edema.  No joint effusions. Neurologic:  Normal speech and language. No gross focal neurologic deficits are appreciated. No facial droop Skin:  Skin is warm, dry and intact. No rash  noted. Psychiatric: Mood and affect are normal. Speech and behavior are normal.  ____________________________________________   LABS (all labs ordered are listed, but only abnormal results are displayed)  Results for orders placed or performed during the hospital encounter of 04/27/17 (from the past 24 hour(s))  CBC with Differential/Platelet     Status: Abnormal   Collection Time: 04/27/17 10:21 AM  Result Value Ref Range   WBC 13.6 (H) 3.6 - 11.0 K/uL   RBC 5.33 (H) 3.80 - 5.20 MIL/uL   Hemoglobin 14.0 12.0 - 16.0 g/dL   HCT 16.142.7 09.635.0 - 04.547.0 %   MCV 80.1 80.0 - 100.0 fL   MCH 26.2 26.0 - 34.0 pg   MCHC 32.7 32.0 - 36.0 g/dL   RDW 40.916.2 (H) 81.111.5 - 91.414.5 %   Platelets 219 150 - 440 K/uL   Neutrophils Relative % 81 %   Neutro Abs 11.0 (H) 1.4 - 6.5 K/uL   Lymphocytes Relative 12 %   Lymphs Abs 1.6 1.0 - 3.6 K/uL   Monocytes Relative 7 %   Monocytes Absolute 1.0 (H) 0.2 - 0.9 K/uL   Eosinophils Relative 0 %   Eosinophils Absolute 0.0 0 - 0.7 K/uL   Basophils Relative 0 %   Basophils Absolute 0.1 0 - 0.1 K/uL  Comprehensive metabolic panel     Status: Abnormal   Collection Time: 04/27/17 10:21 AM  Result Value Ref Range   Sodium 130 (L) 135 - 145 mmol/L   Potassium 3.0 (L) 3.5 - 5.1 mmol/L   Chloride 93 (L) 101 - 111 mmol/L   CO2 21 (L) 22 - 32 mmol/L   Glucose, Bld 90 65 - 99 mg/dL   BUN 17 6 - 20 mg/dL   Creatinine, Ser 7.820.76 0.44 - 1.00 mg/dL   Calcium 9.5 8.9 - 95.610.3 mg/dL   Total Protein 8.3 (H) 6.5 - 8.1 g/dL   Albumin 4.5 3.5 - 5.0 g/dL   AST 21 15 - 41 U/L   ALT 11 (L) 14 - 54 U/L   Alkaline Phosphatase 44 38 - 126 U/L   Total Bilirubin 1.3 (H) 0.3 - 1.2 mg/dL   GFR calc non Af Amer >60 >60 mL/min   GFR calc Af Amer >60 >60 mL/min   Anion gap 16 (H) 5 - 15  Lipase, blood     Status: None   Collection Time: 04/27/17 10:21 AM  Result Value Ref Range   Lipase 22 11 - 51 U/L    ____________________________________________ ________________________________  RADIOLOGY   ____________________________________________   PROCEDURES  Procedure(s) performed:  Procedures    Critical Care performed: no ____________________________________________   INITIAL IMPRESSION / ASSESSMENT AND PLAN / ED COURSE  Pertinent labs & imaging results that were available during my care of the patient were reviewed by me and considered in my medical decision making (see chart for details).  DDX: Hyperemesis, dehydration, like to light abnormality, malnutrition, UTI  Dawn BangJessica Mullenbach is a 26 y.o. who presents to the ED with nausea vomiting in early pregnancy.  Patient is well-appearing and in no acute distress.  We will send blood work for the above differential.  Her abdominal exam  is soft and benign.  Denies any vaginal bleeding or discharge.  Denies any fevers.  Clinical Course as of Apr 28 1511  Fri Apr 27, 2017  1333 Patient with significant ketosis.  No evidence of ketoacidosis.  Beta quant is appropriate.  There is nothing on her exam or presentation that would suggest ectopic.  Will give D5 to help reverse ketosis and reassess.  [PR]  1445 Patient had mentioned to nursing staff that she did have a history of STI.  Given her discomfort and mild leukocytosis I did recommend the patient have testing performed.  Patient has declined this and states she rather follow-up with her OB/GYN.  Says that she is feeling better.  At this point do believe patient is stable and appropriate for follow-up as an outpatient.  [PR]    Clinical Course User Index [PR] Willy Eddyobinson, Cassie Shedlock, MD     ____________________________________________   FINAL CLINICAL IMPRESSION(S) / ED DIAGNOSES  Final diagnoses:  Hyperemesis gravidarum      NEW MEDICATIONS STARTED DURING THIS VISIT:  This SmartLink is deprecated. Use AVSMEDLIST instead to display the medication list for a patient.   Note:   This document was prepared using Dragon voice recognition software and may include unintentional dictation errors.    Willy Eddyobinson, Amenah Tucci, MD 04/27/17 [phone]

## 2017-05-21 ENCOUNTER — Inpatient Hospital Stay
Admission: AD | Admit: 2017-05-21 | Discharge: 2017-05-24 | DRG: 833 | Disposition: A | Discharge status: Home / Self Care | Payer: Medicaid - Out of State | Source: Ambulatory Visit | Attending: Obstetrics and Gynecology | Admitting: Obstetrics and Gynecology

## 2017-05-21 ENCOUNTER — Ambulatory Visit (INDEPENDENT_AMBULATORY_CARE_PROVIDER_SITE_OTHER): Payer: Self-pay | Admitting: Obstetrics and Gynecology

## 2017-05-21 ENCOUNTER — Encounter: Payer: Self-pay | Admitting: Obstetrics and Gynecology

## 2017-05-21 ENCOUNTER — Other Ambulatory Visit: Payer: Self-pay

## 2017-05-21 VITALS — BP 116/64 | Wt 133.0 lb

## 2017-05-21 DIAGNOSIS — O211 Hyperemesis gravidarum with metabolic disturbance: Principal | ICD-10-CM | POA: Diagnosis present

## 2017-05-21 DIAGNOSIS — Z3480 Encounter for supervision of other normal pregnancy, unspecified trimester: Secondary | ICD-10-CM

## 2017-05-21 DIAGNOSIS — Z3A13 13 weeks gestation of pregnancy: Secondary | ICD-10-CM

## 2017-05-21 DIAGNOSIS — O0930 Supervision of pregnancy with insufficient antenatal care, unspecified trimester: Secondary | ICD-10-CM

## 2017-05-21 DIAGNOSIS — Z124 Encounter for screening for malignant neoplasm of cervix: Secondary | ICD-10-CM

## 2017-05-21 DIAGNOSIS — O210 Mild hyperemesis gravidarum: Secondary | ICD-10-CM

## 2017-05-21 LAB — COMPREHENSIVE METABOLIC PANEL
ALT: 30 U/L (ref 14–54)
AST: 39 U/L (ref 15–41)
Albumin: 4.8 g/dL (ref 3.5–5.0)
Alkaline Phosphatase: 47 U/L (ref 38–126)
Anion gap: 15 (ref 5–15)
BUN: 15 mg/dL (ref 6–20)
CO2: 29 mmol/L (ref 22–32)
Calcium: 9.9 mg/dL (ref 8.9–10.3)
Chloride: 89 mmol/L — ABNORMAL LOW (ref 101–111)
Creatinine, Ser: 0.79 mg/dL (ref 0.44–1.00)
GFR calc Af Amer: >60 mL/min (ref >60)
GFR calc non Af Amer: >60 mL/min (ref >60)
Glucose, Bld: 118 mg/dL — ABNORMAL HIGH (ref 65–99)
Potassium: 2.5 mmol/L — CL (ref 3.5–5.1)
Sodium: 133 mmol/L — ABNORMAL LOW (ref 135–145)
Total Bilirubin: 1.1 mg/dL (ref 0.3–1.2)
Total Protein: 8.3 g/dL — ABNORMAL HIGH (ref 6.5–8.1)

## 2017-05-21 LAB — DIFFERENTIAL
Basophils Absolute: 0.1 10*3/uL (ref 0–0.1)
Basophils Relative: 0 %
Eosinophils Absolute: 0 10*3/uL (ref 0–0.7)
Eosinophils Relative: 0 %
Lymphocytes Relative: 11 %
Lymphs Abs: 1.6 10*3/uL (ref 1.0–3.6)
Monocytes Absolute: 1.1 10*3/uL — ABNORMAL HIGH (ref 0.2–0.9)
Monocytes Relative: 8 %
Neutro Abs: 11.2 10*3/uL — ABNORMAL HIGH (ref 1.4–6.5)
Neutrophils Relative %: 81 %

## 2017-05-21 LAB — CBC
HCT: 42.7 % (ref 35.0–47.0)
Hemoglobin: 13.9 g/dL (ref 12.0–16.0)
MCH: 26 pg (ref 26.0–34.0)
MCHC: 32.6 g/dL (ref 32.0–36.0)
MCV: 79.9 fL — ABNORMAL LOW (ref 80.0–100.0)
Platelets: 238 10*3/uL (ref 150–440)
RBC: 5.34 MIL/uL — ABNORMAL HIGH (ref 3.80–5.20)
RDW: 15.5 % — ABNORMAL HIGH (ref 11.5–14.5)
WBC: 14 10*3/uL — ABNORMAL HIGH (ref 3.6–11.0)

## 2017-05-21 LAB — LIPASE, BLOOD: Lipase: 25 U/L (ref 11–51)

## 2017-05-21 LAB — TYPE AND SCREEN
ABO/RH(D): O POS
Antibody Screen: NEGATIVE

## 2017-05-21 MED ORDER — METOCLOPRAMIDE HCL 5 MG/ML IJ SOLN: 10.0000 mg | Freq: Four times a day (QID) | INTRAMUSCULAR | Status: DC | PRN

## 2017-05-21 MED ORDER — ONDANSETRON HCL 4 MG/2ML IJ SOLN
4.0000 mg | Freq: Four times a day (QID) | INTRAMUSCULAR | Status: DC | PRN
  Administered 2017-05-21 – 2017-05-23 (×3): 4 mg via INTRAVENOUS
  Filled 2017-05-21 (×3): qty 2

## 2017-05-21 MED ORDER — POTASSIUM CHLORIDE 10 MEQ/100ML IV SOLN
10.0000 meq | INTRAVENOUS | Status: DC
Start: 2017-05-21 — End: 2017-05-21
  Administered 2017-05-21: 10 meq via INTRAVENOUS
  Filled 2017-05-21 (×4): qty 100

## 2017-05-21 MED ORDER — ACETAMINOPHEN 325 MG PO TABS
650.0000 mg | ORAL_TABLET | ORAL | Status: DC | PRN
  Filled 2017-05-21: qty 2

## 2017-05-21 MED ORDER — DEXTROSE-NACL 5-0.45 % IV SOLN
INTRAVENOUS | Status: DC
  Administered 2017-05-21 – 2017-05-22 (×3): via INTRAVENOUS

## 2017-05-21 MED ORDER — POTASSIUM CHLORIDE 20 MEQ PO PACK
40.0000 meq | PACK | Freq: Once | ORAL | Status: AC
  Administered 2017-05-21: 40 meq via ORAL
  Filled 2017-05-21: qty 2

## 2017-05-21 MED ORDER — M.V.I. ADULT IV INJ
INTRAVENOUS | Status: DC
  Administered 2017-05-21: 22:00:00 via INTRAVENOUS
  Filled 2017-05-21: qty 10

## 2017-05-21 NOTE — Progress Notes (Signed)
Obstetric H&P   Chief Complaint: Hyperemesis Gravidarum  Prenatal Care Provider: [facility]  History of Present Illness: 27 y.o. [id number] [redacted]w[redacted]d by bedside US today showing CRL of 77.74mm EDC 11/21/2017, presenting for NOB today.  The patient is actively vomiting.  Unable to obtain much history.  States she has felt this way for the past month.  She reports a 40lbs weight loss over that time fram was 174lbs prior to this pregnancy.  She denies vaginal bleeding, loss of fluid.  She was seen in ED 04/27/2017 and IV hydrated.  She is currently on diclegis and zofran but is able to keep this down secondary to po intolerance.    Pregravid weight Pregravid weight not on file Total Weight Gain Not found.  pregnancy#3 Problems (from 12/22/16 to present)    No problems associated with this episode.       Review of Systems: 10 point review of systems negative unless otherwise noted in HPI  Past Medical History: History reviewed. No pertinent past medical history.  Past Surgical History: Past Surgical History:  Procedure Laterality Date  . NO PAST SURGERIES     Family History: History reviewed. No pertinent family history.  Social History: Social History   Socioeconomic History  . Marital status: Single    Spouse name: Not on file  . Number of children: Not on file  . Years of education: Not on file  . Highest education level: Not on file  Social Needs  . Financial resource strain: Not on file  . Food insecurity - worry: Not on file  . Food insecurity - inability: Not on file  . Transportation needs - medical: Not on file  . Transportation needs - non-medical: Not on file  Occupational History  . Not on file  Tobacco Use  . Smoking status: Never Smoker  . Smokeless tobacco: Never Used  Substance and Sexual Activity  . Alcohol use: No  . Drug use: No  . Sexual activity: Yes  Other Topics Concern  . Not on file  Social History Narrative  . Not on file    Medications: Prior to  Admission medications   Medication Sig Start Date End Date Taking? Authorizing Provider  Doxylamine-Pyridoxine (DICLEGIS) 10-10 MG TBEC 2 tabs orally at hs (Day 1). If this works, continue taking 2 tab qhs. However, if nausea persists Day 2, take 2 tabs at bedtime that night then take three tablets starting on Day 3 (one tablet in the morning and two tablets at bedtime). If these 3 tablets control symptoms on Day 4, continue taking 3 tabs daily. Otherwise take 4 tabs starting on Day 4 (one tablet in the morning, one tablet mid-afternoon and two tablets at bedtime). Patient not taking: Reported on 04/27/2017 10/14/16   Jeanmarie Plant, MD  Doxylamine-Pyridoxine 10-10 MG TBEC Take 1 tablet by mouth 2 (two) times daily. 04/27/17   Willy Eddy, MD  fluconazole (DIFLUCAN) 150 MG tablet Take 1 tablet (150 mg total) by mouth daily. Patient not taking: Reported on 10/14/2016 09/21/16   Lutricia Feil, PA-C  metoCLOPramide (REGLAN) 10 MG tablet Take 1 tablet (10 mg total) by mouth every 6 (six) hours as needed for nausea. 04/27/17 04/27/18  Willy Eddy, MD  Misc. Devices (TOPI-CLICK PERL VAGINAL DOSING) MISC Place 1 application vaginally at bedtime. For 7 nights Patient not taking: Reported on 10/14/2016 10/03/16   Hassan Rowan, MD  ondansetron (ZOFRAN ODT) 8 MG disintegrating tablet Take 1 tablet (8 mg total) by mouth every  8 (eight) hours as needed for nausea or vomiting. Patient not taking: Reported on 10/14/2016 10/03/16   Hassan RowanWade, Eugene, MD  Prenatal Vit-Fe Fumarate-FA (PRENATAL ONE DAILY) 27-0.8 MG TABS Take 1 tablet by mouth daily. Patient not taking: Reported on 04/27/2017 10/03/16   Hassan RowanWade, Eugene, MD  terconazole (TERAZOL 3) 80 MG vaginal suppository Place 1 suppository (80 mg total) vaginally at bedtime. Use after the clindamycin Patient not taking: Reported on 10/14/2016 10/03/16   Hassan RowanWade, Eugene, MD    Allergies: No Known Allergies  Physical Exam: Vitals: Blood pressure 116/64, weight 133 lb  (60.3 kg), last menstrual period 12/22/2016.  FHT: + FHT visualized on US  General: NAD HEENT: normocephalic, anicteric Pulmonary: No increased work of breathing Cardiovascular: RRR, distal pulses 2+ Abdomen: Gravid, non-tender Extremities: no edema, erythema, or tenderness Neurologic: Grossly intact Psychiatric: mood appropriate, affect full  Labs: No results found for this or any previous visit (from the past 24 hour(s)).  Assessment: 27 y.o. G3P2002 [id number] by besides US CRL today viable singleton IUP, with hyperemesis gravidarum  Plan: 1) Hyperemesis - admit to mother baby for IV hydration, labs, and antiemetics  2) Fetus - viable IUP at 231w5d by CRL, consider formal US once hyperemsis improved  3) PNL - not drawn yet  4) Immunization History -   There is no immunization history on file for this patient.  5) Disposition - pending improvement in symptoms

## 2017-05-21 NOTE — Progress Notes (Signed)
NOB Unsure of LMP d/t cycles being abnormal N&V

## 2017-05-21 NOTE — Progress Notes (Signed)
Lab called with critical Potassium level. IV potassium ordered 10 mEq/hr x 4 doses. Patient unable to tolerate IV potassium even at reduced flow rate. Order changed to oral potassium with repeat lab draw at 2 am.  Dawn MallJane Clarke Amburn, CNM

## 2017-05-22 ENCOUNTER — Encounter (HOSPITAL_COMMUNITY): Payer: Self-pay

## 2017-05-22 DIAGNOSIS — O99280 Endocrine, nutritional and metabolic diseases complicating pregnancy, unspecified trimester: Secondary | ICD-10-CM

## 2017-05-22 DIAGNOSIS — O26892 Other specified pregnancy related conditions, second trimester: Secondary | ICD-10-CM

## 2017-05-22 DIAGNOSIS — O211 Hyperemesis gravidarum with metabolic disturbance: Secondary | ICD-10-CM

## 2017-05-22 LAB — HIV ANTIBODY (ROUTINE TESTING W REFLEX): HIV Screen 4th Generation wRfx: NONREACTIVE

## 2017-05-22 LAB — URINALYSIS, ROUTINE W REFLEX MICROSCOPIC
Bilirubin Urine: NEGATIVE
Glucose, UA: NEGATIVE mg/dL
Hgb urine dipstick: NEGATIVE
Ketones, ur: 20 mg/dL — AB
Leukocytes, UA: NEGATIVE
Nitrite: NEGATIVE
Protein, ur: NEGATIVE mg/dL
Specific Gravity, Urine: 1.012 (ref 1.005–1.030)
pH: 6 (ref 5.0–8.0)

## 2017-05-22 LAB — BASIC METABOLIC PANEL
Anion gap: 8 (ref 5–15)
BUN: 8 mg/dL (ref 6–20)
CO2: 30 mmol/L (ref 22–32)
Calcium: 8.7 mg/dL — ABNORMAL LOW (ref 8.9–10.3)
Chloride: 92 mmol/L — ABNORMAL LOW (ref 101–111)
Creatinine, Ser: 0.82 mg/dL (ref 0.44–1.00)
GFR calc Af Amer: >60 mL/min (ref >60)
GFR calc non Af Amer: >60 mL/min (ref >60)
Glucose, Bld: 124 mg/dL — ABNORMAL HIGH (ref 65–99)
Potassium: 2.6 mmol/L — CL (ref 3.5–5.1)
Sodium: 130 mmol/L — ABNORMAL LOW (ref 135–145)

## 2017-05-22 LAB — POTASSIUM: Potassium: 3 mmol/L — ABNORMAL LOW (ref 3.5–5.1)

## 2017-05-22 LAB — RUBELLA SCREEN: Rubella: 5.96 index (ref >0.99)

## 2017-05-22 LAB — VARICELLA ZOSTER ANTIBODY, IGG: Varicella IgG: 136 index — ABNORMAL LOW (ref >165)

## 2017-05-22 LAB — RPR: RPR Ser Ql: NONREACTIVE

## 2017-05-22 LAB — HEPATITIS B SURFACE ANTIGEN: Hepatitis B Surface Ag: NEGATIVE

## 2017-05-22 LAB — MAGNESIUM: Magnesium: 2.3 mg/dL (ref 1.7–2.4)

## 2017-05-22 MED ORDER — POTASSIUM CHLORIDE 20 MEQ PO PACK
20.0000 meq | PACK | Freq: Two times a day (BID) | ORAL | Status: DC
  Administered 2017-05-22 – 2017-05-23 (×3): 20 meq via ORAL
  Filled 2017-05-22 (×4): qty 1

## 2017-05-22 MED ORDER — FAMOTIDINE IN NACL 20-0.9 MG/50ML-% IV SOLN
20.0000 mg | Freq: Two times a day (BID) | INTRAVENOUS | Status: DC
  Administered 2017-05-22 – 2017-05-23 (×3): 20 mg via INTRAVENOUS
  Filled 2017-05-22 (×3): qty 50

## 2017-05-22 MED ORDER — THIAMINE HCL 100 MG/ML IJ SOLN
Freq: Every day | INTRAVENOUS | Status: DC
  Administered 2017-05-22 – 2017-05-23 (×2): via INTRAVENOUS
  Filled 2017-05-22 (×4): qty 1000

## 2017-05-22 MED ORDER — METOCLOPRAMIDE HCL 5 MG/ML IJ SOLN
10.0000 mg | Freq: Four times a day (QID) | INTRAMUSCULAR | Status: DC
  Administered 2017-05-22: 10 mg via INTRAVENOUS
  Filled 2017-05-22: qty 2

## 2017-05-22 MED ORDER — METOCLOPRAMIDE HCL 5 MG/ML IJ SOLN
10.0000 mg | Freq: Three times a day (TID) | INTRAMUSCULAR | Status: DC
  Administered 2017-05-22 – 2017-05-23 (×2): 10 mg via INTRAVENOUS
  Filled 2017-05-22 (×2): qty 2

## 2017-05-22 MED ORDER — DOCUSATE SODIUM 100 MG PO CAPS
100.0000 mg | ORAL_CAPSULE | Freq: Every day | ORAL | Status: DC
  Administered 2017-05-22 – 2017-05-24 (×3): 100 mg via ORAL
  Filled 2017-05-22 (×3): qty 1

## 2017-05-22 NOTE — Progress Notes (Signed)
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 Hungry! Nurse reports that patient has been mostly spitting up rather than vomiting since starting IV antiemetics. Tolerating some clear liquids and kept down oral potassium. Has been getting Zofran IV q6hours   O: BP 115/78 (BP Location: Left Arm)   Pulse 85   Temp 98 F (36.7 C)   Resp 18   Ht 5\' 6"  (1.676 m)   Wt 60.3 kg (133 lb)   LMP 12/22/2016   SpO2 99%   BMI 21.47 kg/m   General: sleepy, but awakened with visitors in room Very quiet, answering questions with one word answers Heart: RRR without murmur Lungs: no respiratory difficulty Abdomen: FH about 1/2 between SP and U Tenderness in lower abdomen to palpation Ext: no tenderness or edema   Results for orders placed or performed during the hospital encounter of 05/21/17 (from the past 24 hour(s))  Hepatitis B surface antigen     Status: None   Collection Time: 05/21/17  4:27 PM  Result Value Ref Range   Hepatitis B Surface Ag Negative Negative  Rubella screen     Status: None   Collection Time: 05/21/17  4:27 PM  Result Value Ref Range   Rubella 5.96 Immune >0.99 index  RPR     Status: None   Collection Time: 05/21/17  4:27 PM  Result Value Ref Range   RPR Ser Ql Non Reactive Non Reactive  CBC     Status: Abnormal   Collection Time: 05/21/17  4:27 PM  Result Value Ref Range   WBC 14.0 (H) 3.6 - 11.0 K/uL   RBC 5.34 (H) 3.80 - 5.20 MIL/uL   Hemoglobin 13.9 12.0 - 16.0 g/dL   HCT 16.1 09.6 - 04.5 %   MCV 79.9 (L) 80.0 - 100.0 fL   MCH 26.0 26.0 - 34.0 pg   MCHC 32.6 32.0 - 36.0 g/dL   RDW 40.9 (H) 81.1 - 91.4 %   Platelets 238 150 - 440 K/uL  Differential     Status: Abnormal   Collection Time: 05/21/17  4:27 PM  Result Value Ref Range   Neutrophils Relative % 81 %   Neutro Abs 11.2 (H) 1.4 - 6.5 K/uL   Lymphocytes Relative 11 %   Lymphs Abs 1.6 1.0 - 3.6 K/uL   Monocytes Relative 8 %   Monocytes Absolute 1.1 (H) 0.2 - 0.9 K/uL   Eosinophils Relative 0 %   Eosinophils Absolute 0.0 0 - 0.7 K/uL   Basophils Relative 0 %   Basophils Absolute 0.1 0 - 0.1 K/uL  HIV antibody (routine testing)     Status: None   Collection Time: 05/21/17  4:27 PM  Result Value Ref Range   HIV Screen 4th Generation wRfx Non Reactive Non Reactive  Lipase, blood     Status: None   Collection Time: 05/21/17  4:27 PM  Result Value Ref Range   Lipase 25 11 - 51 U/L  Comprehensive metabolic panel     Status: Abnormal   Collection Time: 05/21/17  4:27 PM  Result Value Ref Range   Sodium 133 (L) 135 - 145 mmol/L   Potassium 2.5 (LL) 3.5 - 5.1 mmol/L   Chloride 89 (L) 101 - 111 mmol/L   CO2 29 22 - 32 mmol/L   Glucose, Bld 118 (H) 65 - 99 mg/dL   BUN 15 6 - 20 mg/dL   Creatinine, Ser 7.82 0.44 - 1.00 mg/dL  Calcium 9.9 8.9 - 10.3 mg/dL   Total Protein 8.3 (H) 6.5 - 8.1 g/dL   Albumin 4.8 3.5 - 5.0 g/dL   AST 39 15 - 41 U/L   ALT 30 14 - 54 U/L   Alkaline Phosphatase 47 38 - 126 U/L   Total Bilirubin 1.1 0.3 - 1.2 mg/dL   GFR calc non Af Amer >60 >60 mL/min   GFR calc Af Amer >60 >60 mL/min   Anion gap 15 5 - 15  Type and screen [facility]     Status: None   Collection Time: 05/21/17  4:27 PM  Result Value Ref Range   ABO/RH(D) O POS    Antibody Screen NEG    Sample Expiration      05/24/2017 Performed at [facility], [address] [place], [place] [id number]   Potassium     Status: Abnormal   Collection Time: 05/22/17  1:48 AM  Result Value Ref Range   Potassium 3.0 (L) 3.5 - 5.1 mmol/L  Urinalysis, Routine w reflex microscopic     Status: Abnormal   Collection Time: 05/22/17  6:46 AM  Result Value Ref Range   Color, Urine YELLOW (A) YELLOW   APPearance HAZY (A) CLEAR   Specific Gravity, Urine 1.012 1.005 - 1.030   pH 6.0 5.0 - 8.0   Glucose, UA NEGATIVE NEGATIVE mg/dL   Hgb urine dipstick NEGATIVE NEGATIVE   Bilirubin Urine NEGATIVE NEGATIVE   Ketones, ur 20 (A) NEGATIVE mg/dL    Protein, ur NEGATIVE NEGATIVE mg/dL   Nitrite NEGATIVE NEGATIVE   Leukocytes, UA NEGATIVE NEGATIVE  Basic metabolic panel     Status: Abnormal   Collection Time: 05/22/17  8:10 AM  Result Value Ref Range   Sodium 130 (L) 135 - 145 mmol/L   Potassium 2.6 (LL) 3.5 - 5.1 mmol/L   Chloride 92 (L) 101 - 111 mmol/L   CO2 30 22 - 32 mmol/L   Glucose, Bld 124 (H) 65 - 99 mg/dL   BUN 8 6 - 20 mg/dL   Creatinine, Ser 1.910.82 0.44 - 1.00 mg/dL   Calcium 8.7 (L) 8.9 - 10.3 mg/dL   GFR calc non Af Amer >60 >60 mL/min   GFR calc Af Amer >60 >60 mL/min   Anion gap 8 5 - 15  Magnesium     Status: None   Collection Time: 05/22/17  8:10 AM  Result Value Ref Range   Magnesium 2.3 1.7 - 2.4 mg/dL    A: Hyperemesis with hyponatremia, low Chloride, and hypokalemia Tolerating clear liquids with Zofran  P: Banana bag solution switched to NS and potassium added to bag KCL 20 meq po BID Reglan 10 mgm tid before meals Pepcid 20 mgm IV BID Repeat BMP in AM BRAT/soft diet  Farrel Connersolleen Christoffer Currier, CNM

## 2017-05-23 DIAGNOSIS — O211 Hyperemesis gravidarum with metabolic disturbance: Secondary | ICD-10-CM

## 2017-05-23 DIAGNOSIS — O99280 Endocrine, nutritional and metabolic diseases complicating pregnancy, unspecified trimester: Secondary | ICD-10-CM

## 2017-05-23 DIAGNOSIS — O26892 Other specified pregnancy related conditions, second trimester: Secondary | ICD-10-CM

## 2017-05-23 LAB — URINE CULTURE: Organism ID, Bacteria: NO GROWTH

## 2017-05-23 LAB — BASIC METABOLIC PANEL
Anion gap: 6 (ref 5–15)
BUN: <5 mg/dL — ABNORMAL LOW (ref 6–20)
CO2: 27 mmol/L (ref 22–32)
Calcium: 8.5 mg/dL — ABNORMAL LOW (ref 8.9–10.3)
Chloride: 100 mmol/L — ABNORMAL LOW (ref 101–111)
Creatinine, Ser: 0.74 mg/dL (ref 0.44–1.00)
GFR calc Af Amer: >60 mL/min (ref >60)
GFR calc non Af Amer: >60 mL/min (ref >60)
Glucose, Bld: 93 mg/dL (ref 65–99)
Potassium: 2.9 mmol/L — ABNORMAL LOW (ref 3.5–5.1)
Sodium: 133 mmol/L — ABNORMAL LOW (ref 135–145)

## 2017-05-23 MED ORDER — POTASSIUM CHLORIDE 20 MEQ PO PACK
20.0000 meq | PACK | Freq: Three times a day (TID) | ORAL | Status: DC
  Administered 2017-05-23 – 2017-05-24 (×3): 20 meq via ORAL
  Filled 2017-05-23 (×3): qty 1

## 2017-05-23 MED ORDER — FAMOTIDINE 20 MG PO TABS
20.0000 mg | ORAL_TABLET | Freq: Two times a day (BID) | ORAL | Status: DC
  Administered 2017-05-23 – 2017-05-24 (×2): 20 mg via ORAL
  Filled 2017-05-23 (×2): qty 1

## 2017-05-23 MED ORDER — ONDANSETRON HCL 4 MG PO TABS
4.0000 mg | ORAL_TABLET | ORAL | Status: DC | PRN
  Administered 2017-05-24: 4 mg via ORAL
  Filled 2017-05-23: qty 1

## 2017-05-23 MED ORDER — METOCLOPRAMIDE HCL 10 MG PO TABS: 10.0000 mg | ORAL_TABLET | Freq: Three times a day (TID) | ORAL | Status: DC

## 2017-05-23 MED ORDER — MAGNESIUM SULFATE IN D5W 1-5 GM/100ML-% IV SOLN
1.0000 g | Freq: Once | INTRAVENOUS | Status: AC
  Administered 2017-05-23: 1 g via INTRAVENOUS
  Filled 2017-05-23: qty 100

## 2017-05-23 MED ORDER — METOCLOPRAMIDE HCL 10 MG PO TABS
10.0000 mg | ORAL_TABLET | Freq: Three times a day (TID) | ORAL | Status: DC
  Administered 2017-05-23 – 2017-05-24 (×3): 10 mg via ORAL
  Filled 2017-05-23 (×3): qty 1

## 2017-05-23 NOTE — Progress Notes (Signed)
Notified MD of K+ 2.9. No new orders at this time. Will continue to monitor and assess.

## 2017-05-23 NOTE — Progress Notes (Signed)
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 Hungry! Nurse reports that patient has been mostly spitting up rather than vomiting since starting IV antiemetics. Tolerating some clear liquids and kept down oral potassium. Has been getting Zofran IV q6hours   O: BP 97/63 (BP Location: Right Arm)   Pulse 78   Temp 98 F (36.7 C) (Oral)   Resp 18   Ht 5\' 6"  (1.676 m)   Wt 60.3 kg (133 lb)   LMP 12/22/2016   SpO2 99%   BMI 21.47 kg/m   General: sleepy, but awakened with visitors in room Very quiet, answering questions with one word answers Heart: RRR without murmur Lungs: no respiratory difficulty Abdomen: FH about 1/2 between SP and U Tenderness in lower abdomen to palpation Ext: no tenderness or edema   Results for orders placed or performed during the hospital encounter of 05/21/17 (from the past 24 hour(s))  Basic metabolic panel     Status: Abnormal   Collection Time: 05/23/17  5:31 AM  Result Value Ref Range   Sodium 133 (L) 135 - 145 mmol/L   Potassium 2.9 (L) 3.5 - 5.1 mmol/L   Chloride 100 (L) 101 - 111 mmol/L   CO2 27 22 - 32 mmol/L   Glucose, Bld 93 65 - 99 mg/dL   BUN <5 (L) 6 - 20 mg/dL   Creatinine, Ser 4.090.74 0.44 - 1.00 mg/dL   Calcium 8.5 (L) 8.9 - 10.3 mg/dL   GFR calc non Af Amer >60 >60 mL/min   GFR calc Af Amer >60 >60 mL/min   Anion gap 6 5 - 15    A: Hyperemesis with hyponatremia, low Chloride, and hypokalemia Tolerating clear liquids with Zofran  P: Banana bag solution switched to NS and potassium added to bag KCL 20 meq po BID Reglan 10 mgm tid before meals Pepcid 20 mgm IV BID Repeat BMP in AM BRAT/soft diet  Farrel Connersolleen Malena Timpone, CNM

## 2017-05-23 NOTE — Progress Notes (Signed)
PAD #2 Hyperemesis-IUP at 14 weeks  S: Feeling much better. Kept down some mashed potatoes yesterday. Currently receiving Reglan 10 mgm IV ac.  Was given Zofran once last evening for nausea. Having ptyalism  O: BP 97/63 (BP Location: Right Arm)   Pulse 78   Temp 98 F (36.7 C) (Oral)   Resp 18   Ht 5\' 6"  (1.676 m)   Wt 60.3 kg (133 lb)   LMP 12/22/2016   SpO2 99%   BMI 21.47 kg/m   Urine output has increased-93750ml+ over last 24 hours General: awake and alert, asking about going home  Heart: RRR without murmur Lungs: no respiratory difficulty Abdomen: FH about 1/2 between SP and U. FHTs 146 BPM Ext: no tenderness or edema   Results for orders placed or performed during the hospital encounter of 05/21/17 (from the past 24 hour(s))  Basic metabolic panel     Status: Abnormal   Collection Time: 05/23/17  5:31 AM  Result Value Ref Range   Sodium 133 (L) 135 - 145 mmol/L   Potassium 2.9 (L) 3.5 - 5.1 mmol/L   Chloride 100 (L) 101 - 111 mmol/L   CO2 27 22 - 32 mmol/L   Glucose, Bld 93 65 - 99 mg/dL   BUN <5 (L) 6 - 20 mg/dL   Creatinine, Ser 1.610.74 0.44 - 1.00 mg/dL   Calcium 8.5 (L) 8.9 - 10.3 mg/dL   GFR calc non Af Amer >60 >60 mL/min   GFR calc Af Amer >60 >60 mL/min   Anion gap 6 5 - 15    A: Hyperemesis  with continued hypokalemia-despite po and IV supplementation and decreased nausea and vomiting. Hyponatremia and hypochloremia-improved   P: Continue banana bag solution with NS and potassium added to bag. Magnesium 1 GM IV x1 KCL 20 meq po TID Reglan 10 mgm  tid before meals-switch to po Pepcid 20 mgm  BID-switch to po Repeat BMP in AM Regular diet as tolerated  Dawn Skinner, CNM

## 2017-05-24 DIAGNOSIS — O99280 Endocrine, nutritional and metabolic diseases complicating pregnancy, unspecified trimester: Secondary | ICD-10-CM

## 2017-05-24 DIAGNOSIS — O211 Hyperemesis gravidarum with metabolic disturbance: Secondary | ICD-10-CM

## 2017-05-24 LAB — BASIC METABOLIC PANEL
Anion gap: 6 (ref 5–15)
BUN: <5 mg/dL — ABNORMAL LOW (ref 6–20)
CO2: 24 mmol/L (ref 22–32)
Calcium: 8.5 mg/dL — ABNORMAL LOW (ref 8.9–10.3)
Chloride: 105 mmol/L (ref 101–111)
Creatinine, Ser: 0.57 mg/dL (ref 0.44–1.00)
GFR calc Af Amer: >60 mL/min (ref >60)
GFR calc non Af Amer: >60 mL/min (ref >60)
Glucose, Bld: 82 mg/dL (ref 65–99)
Potassium: 3.7 mmol/L (ref 3.5–5.1)
Sodium: 135 mmol/L (ref 135–145)

## 2017-05-24 MED ORDER — ONDANSETRON HCL 4 MG PO TABS: 4.0000 mg | ORAL_TABLET | ORAL | 2 refills | Status: DC | PRN

## 2017-05-24 MED ORDER — METOCLOPRAMIDE HCL 10 MG PO TABS: 10.0000 mg | ORAL_TABLET | Freq: Three times a day (TID) | ORAL | 3 refills | Status: DC

## 2017-05-24 MED ORDER — DOXYLAMINE-PYRIDOXINE 10-10 MG PO TBEC: 2.0000 | DELAYED_RELEASE_TABLET | Freq: Every day | ORAL | 5 refills | Status: DC

## 2017-05-24 MED ORDER — FAMOTIDINE 20 MG PO TABS: 20.0000 mg | ORAL_TABLET | Freq: Two times a day (BID) | ORAL | 2 refills | Status: DC

## 2017-05-24 NOTE — Progress Notes (Signed)
Patient discharged home. Discharge instructions and prescriptions given and reviewed with patient. Patient verbalized understanding. Escorted out by auxillary.  

## 2017-05-24 NOTE — Discharge Summary (Signed)
Day 3 - Hyperemesis gravidarum Subjective:  Patient is feeling much better this morning and asking to go home. Able to tolerate food and liquids.  Denies pain. Voiding without difficulty. Tolerating a regular diet. Ambulating well.  Objective:   Blood pressure 115/78, pulse 92, temperature 98.5 F (36.9 C), temperature source Oral, resp. rate 18, height '5\' 6"'$  (1.676 m), weight 133 lb (60.3 kg), last menstrual period 12/22/2016, SpO2 100 %.  General: NAD Pulmonary: no increased work of breathing Abdomen: non-distended, non-tender Extremities: no edema, no erythema, no tenderness, no signs of DVT  Results for orders placed or performed during the hospital encounter of 05/21/17 (from the past 72 hour(s))  Hepatitis B surface antigen     Status: None   Collection Time: 05/21/17  4:27 PM  Result Value Ref Range   Hepatitis B Surface Ag Negative Negative    Comment: (NOTE) Performed At: [facility] [place], [place] [id number] Rush Farmer MD [phone] Performed at [facility], [address] [place], [place] [address]   Rubella screen     Status: None   Collection Time: 05/21/17  4:27 PM  Result Value Ref Range   Rubella 5.96 Immune >0.99 index    Comment: (NOTE)                                Non-immune       <0.90                                Equivocal  0.90 - 0.99                                Immune           >0.99 Performed At: [facility] [place], [place] [id number] [place] Farmer MD [phone] Performed at [facility], [address] [place], [place] [address]   RPR     Status: None   Collection Time: 05/21/17  4:27 PM  Result Value Ref Range   RPR Ser Ql Non Reactive Non Reactive    Comment: (NOTE) Performed At: [facility] [place], [place] [id number] Rush Farmer MD [phone] Performed at [facility], [address] [place], [place]  [address]   CBC     Status: Abnormal   Collection Time: 05/21/17  4:27 PM  Result Value Ref Range   WBC 14.0 (H) 3.6 - 11.0 K/uL   RBC 5.34 (H) 3.80 - 5.20 MIL/uL   Hemoglobin 13.9 12.0 - 16.0 g/dL   HCT 42.7 35.0 - 47.0 %   MCV 79.9 (L) 80.0 - 100.0 fL   MCH 26.0 26.0 - 34.0 pg   MCHC 32.6 32.0 - 36.0 g/dL   RDW 15.5 (H) 11.5 - 14.5 %   Platelets 238 150 - 440 K/uL    Comment: Performed at [facility], [address] [place], [place] [address]  Differential     Status: Abnormal   Collection Time: 05/21/17  4:27 PM  Result Value Ref Range   Neutrophils Relative % 81 %   Neutro Abs 11.2 (H) 1.4 - 6.5 K/uL   Lymphocytes Relative 11 %   Lymphs Abs 1.6 1.0 - 3.6 K/uL   Monocytes Relative 8 %   Monocytes Absolute  1.1 (H) 0.2 - 0.9 K/uL   Eosinophils Relative 0 %   Eosinophils Absolute 0.0 0 - 0.7 K/uL   Basophils Relative 0 %   Basophils Absolute 0.1 0 - 0.1 K/uL    Comment: Performed at [facility], [address] [place], [place] [address]  HIV antibody (routine testing)     Status: None   Collection Time: 05/21/17  4:27 PM  Result Value Ref Range   HIV Screen 4th Generation wRfx Non Reactive Non Reactive    Comment: (NOTE) Performed At: [facility] [address] [place], [place] [id number] Rush Farmer MD [phone] Performed at [facility], [address] [place], [place] [address]   Lipase, blood     Status: None   Collection Time: 05/21/17  4:27 PM  Result Value Ref Range   Lipase 25 11 - 51 U/L    Comment: Performed at [facility], [address] [place], [place] [address]  Comprehensive metabolic panel     Status: Abnormal   Collection Time: 05/21/17  4:27 PM  Result Value Ref Range   Sodium 133 (L) 135 - 145 mmol/L   Potassium 2.5 (LL) 3.5 - 5.1 mmol/L    Comment: CRITICAL RESULT CALLED TO, READ BACK BY AND VERIFIED WITH LAURA [facility] ON 05/21/17 AT 1946 [facility]    Chloride 89 (L) 101 - 111 mmol/L   CO2 29  22 - 32 mmol/L   Glucose, Bld 118 (H) 65 - 99 mg/dL   BUN 15 6 - 20 mg/dL   Creatinine, Ser 0.79 0.44 - 1.00 mg/dL   Calcium 9.9 8.9 - 10.3 mg/dL   Total Protein 8.3 (H) 6.5 - 8.1 g/dL   Albumin 4.8 3.5 - 5.0 g/dL   AST 39 15 - 41 U/L   ALT 30 14 - 54 U/L   Alkaline Phosphatase 47 38 - 126 U/L   Total Bilirubin 1.1 0.3 - 1.2 mg/dL   GFR calc non Af Amer >60 >60 mL/min   GFR calc Af Amer >60 >60 mL/min    Comment: (NOTE) The eGFR has been calculated using the CKD EPI equation. This calculation has not been validated in all clinical situations. eGFR's persistently <60 mL/min signify possible Chronic Kidney Disease.    Anion gap 15 5 - 15    Comment: Performed at [facility], [address] [place], [place] [address]  Type and screen [place]     Status: None   Collection Time: 05/21/17  4:27 PM  Result Value Ref Range   ABO/RH(D) O POS    Antibody Screen NEG    Sample Expiration      05/24/2017 Performed at [facility], [address] [place], [place] [address]   Varicella zoster antibody, IgG     Status: Abnormal   Collection Time: 05/21/17  4:27 PM  Result Value Ref Range   Varicella IgG 136 (L) Immune >165 index    Comment: (NOTE) A second sample should be collected and tested no less than 2-4 weeks.                               Negative          <135                               Equivocal  135 - 165                               Positive          >165 A positive result generally indicates exposure to the pathogen or administration of specific immunoglobulins, but it is not indication of active infection or stage of disease. Performed At: [facility] [place], [place] [id number] Rush Farmer MD [phone] Performed at [facility], [address] [place], [place] [address]   Potassium     Status: Abnormal   Collection Time: 05/22/17  1:48 AM  Result Value Ref Range    Potassium 3.0 (L) 3.5 - 5.1 mmol/L    Comment: Performed at [facility], [address] [place], [place] [address]  Urinalysis, Routine w reflex microscopic     Status: Abnormal   Collection Time: 05/22/17  6:46 AM  Result Value Ref Range   Color, Urine YELLOW (A) YELLOW   APPearance HAZY (A) CLEAR   Specific Gravity, Urine 1.012 1.005 - 1.030   pH 6.0 5.0 - 8.0   Glucose, UA NEGATIVE NEGATIVE mg/dL   Hgb urine dipstick NEGATIVE NEGATIVE   Bilirubin Urine NEGATIVE NEGATIVE   Ketones, ur 20 (A) NEGATIVE mg/dL   Protein, ur NEGATIVE NEGATIVE mg/dL   Nitrite NEGATIVE NEGATIVE   Leukocytes, UA NEGATIVE NEGATIVE    Comment: Performed at [facility], [address] [place], [place] [address]  Basic metabolic panel     Status: Abnormal   Collection Time: 05/22/17  8:10 AM  Result Value Ref Range   Sodium 130 (L) 135 - 145 mmol/L   Potassium 2.6 (LL) 3.5 - 5.1 mmol/L    Comment: CRITICAL RESULT CALLED TO, READ BACK BY AND VERIFIED WITH [facility] PIERCE AT 0854 05/22/17 DAS    Chloride 92 (L) 101 - 111 mmol/L   CO2 30 22 - 32 mmol/L   Glucose, Bld 124 (H) 65 - 99 mg/dL   BUN 8 6 - 20 mg/dL   Creatinine, Ser 0.82 0.44 - 1.00 mg/dL   Calcium 8.7 (L) 8.9 - 10.3 mg/dL   GFR calc non Af Amer >60 >60 mL/min   GFR calc Af Amer >60 >60 mL/min    Comment: (NOTE) The eGFR has been calculated using the CKD EPI equation. This calculation has not been validated in all clinical situations. eGFR's persistently <60 mL/min signify possible Chronic Kidney Disease.    Anion gap 8 5 - 15    Comment: Performed at [facility], [address] [place], [place] [address]  Magnesium     Status: None   Collection Time: 05/22/17  8:10 AM  Result Value Ref Range   Magnesium 2.3 1.7 - 2.4 mg/dL    Comment: Performed at [facility], [address] [place], [place] [address]  Basic metabolic panel     Status: Abnormal   Collection Time: 05/23/17  5:31 AM  Result  Value Ref Range   Sodium 133 (L) 135 - 145 mmol/L   Potassium 2.9 (L) 3.5 - 5.1 mmol/L   Chloride 100 (L) 101 - 111 mmol/L   CO2 27 22 - 32 mmol/L   Glucose, Bld 93 65 - 99 mg/dL   BUN <5 (L) 6 - 20 mg/dL   Creatinine, Ser 0.74 0.44 - 1.00 mg/dL   Calcium 8.5 (L) 8.9 - 10.3 mg/dL   GFR calc non Af  Amer >60 >60 mL/min   GFR calc Af Amer >60 >60 mL/min    Comment: (NOTE) The eGFR has been calculated using the CKD EPI equation. This calculation has not been validated in all clinical situations. eGFR's persistently <60 mL/min signify possible Chronic Kidney Disease.    Anion gap 6 5 - 15    Comment: Performed at [facility], [address] [place], [place] [address]  Basic metabolic panel     Status: Abnormal   Collection Time: 05/24/17  6:32 AM  Result Value Ref Range   Sodium 135 135 - 145 mmol/L   Potassium 3.7 3.5 - 5.1 mmol/L   Chloride 105 101 - 111 mmol/L   CO2 24 22 - 32 mmol/L   Glucose, Bld 82 65 - 99 mg/dL   BUN <5 (L) 6 - 20 mg/dL   Creatinine, Ser 0.57 0.44 - 1.00 mg/dL   Calcium 8.5 (L) 8.9 - 10.3 mg/dL   GFR calc non Af Amer >60 >60 mL/min   GFR calc Af Amer >60 >60 mL/min    Comment: (NOTE) The eGFR has been calculated using the CKD EPI equation. This calculation has not been validated in all clinical situations. eGFR's persistently <60 mL/min signify possible Chronic Kidney Disease.    Anion gap 6 5 - 15    Comment: Performed at [facility], [address] [place],  [address]    Assessment:   27 y.o. [phone] with hyperemesis gravidarum, much improved; hypokalemia, hyponatremia and hypochloremia are resolved and she is able to tolerate a bland regular diet.  Plan:   Discharge home with oral antiemetics and antacid. Patient advised to take antiemetic medications before eating to try to pre-empt episodes of vomiting.  [facility] up at [facility] for new OB visit and ultrasound; begin regular prenatal care visits.  Avel Sensor,  CNM 05/24/2017  10:11 AM

## 2017-06-02 NOTE — H&P (Signed)
Date of Initial H&P: clinic note 05/21/2017

## 2017-06-27 ENCOUNTER — Telehealth: Payer: Self-pay | Admitting: Obstetrics and Gynecology

## 2017-06-27 NOTE — Telephone Encounter (Signed)
Pt was to be schedule

## 2017-06-27 NOTE — Telephone Encounter (Signed)
Unable to reach patient due to phone difficulty. Phone number no longer active nor receiving incoming call . Pt was to schedule ob u/s and follow up

## 2017-06-27 NOTE — Telephone Encounter (Signed)
[facility] will be sending letter to Patient's know address

## 2017-12-04 IMAGING — US US OB TRANSVAGINAL
1 series · 13 of 28 positions shown · non-contrast
Comparison: None.

CLINICAL DATA: Left adnexal pain and tenderness. Positive pregnancy
test.

EXAM:
OBSTETRIC <14 WK US AND TRANSVAGINAL OB US
TECHNIQUE: Both transabdominal and transvaginal ultrasound examinations were
performed for complete evaluation of the gestation as well as the
maternal uterus, adnexal regions, and pelvic cul-de-sac.
Transvaginal technique was performed to assess early pregnancy.

[Series 1: us ob transvaginal · 0.10mm/px · 13 of 92 slices shown]
[im 4/92]
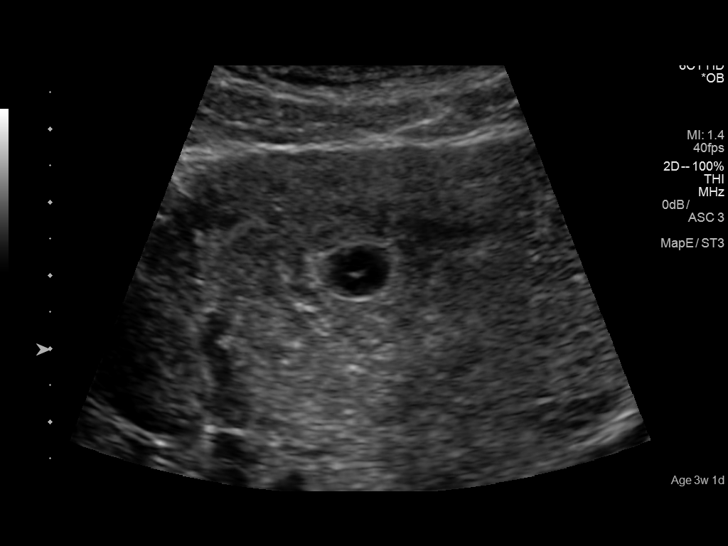
[im 11/92]
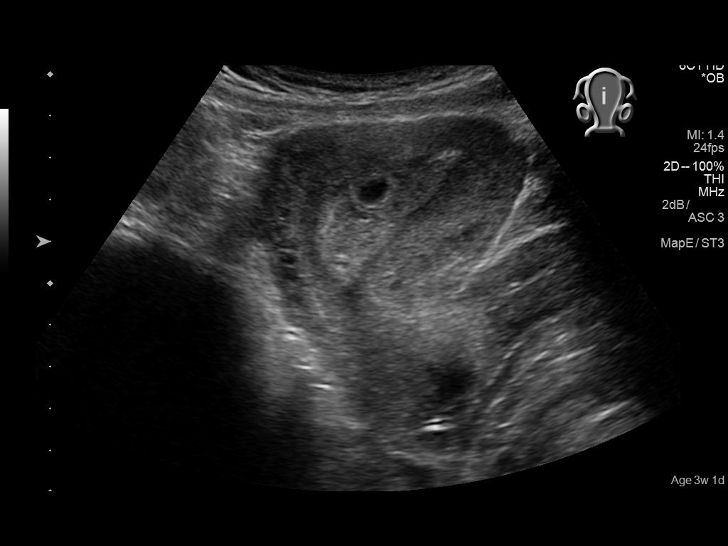
[im 17/92]
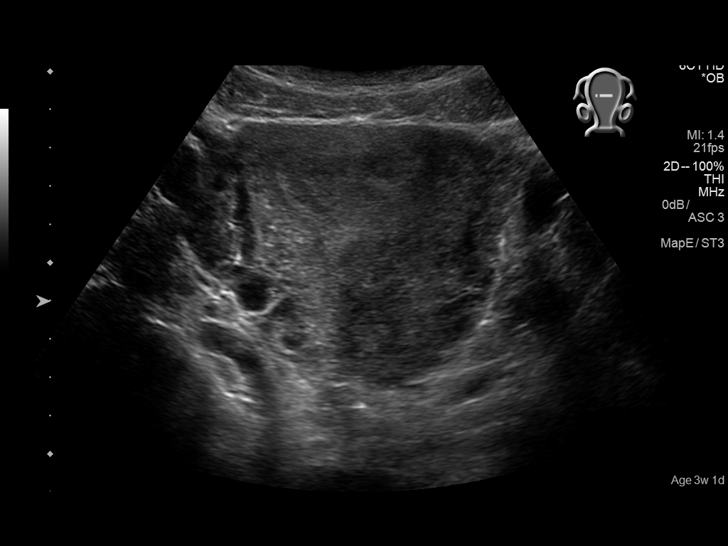
[im 24/92]
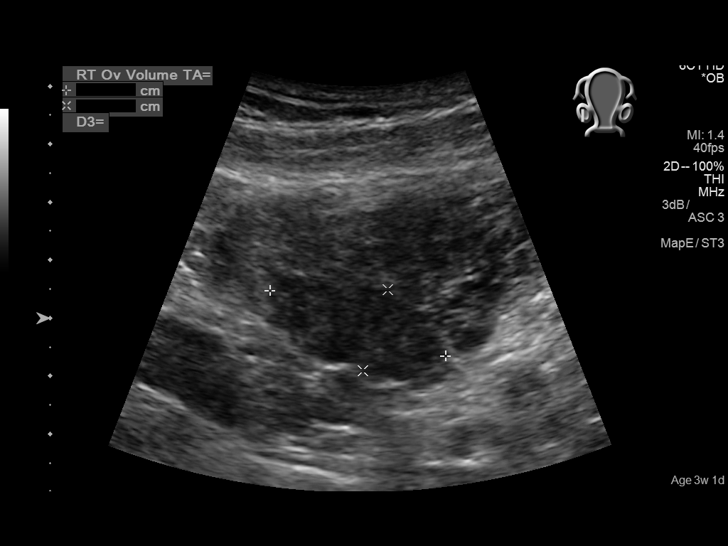
[im 31/92]
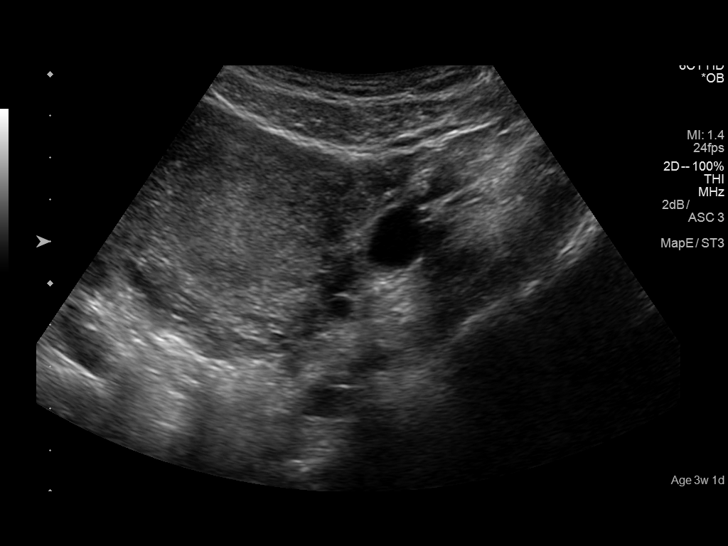
[im 38/92]
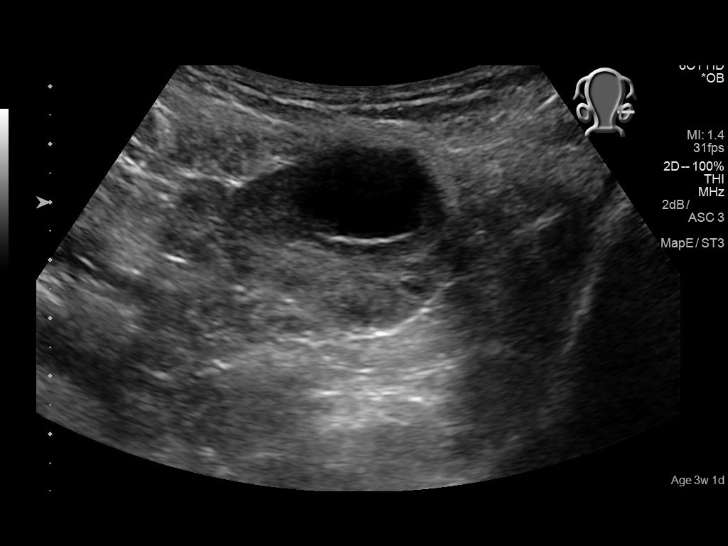
[im 48/92]
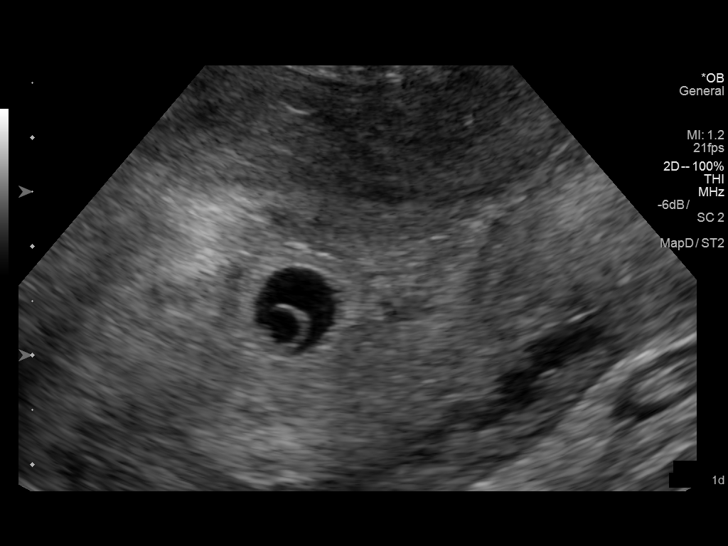
[im 54/92]
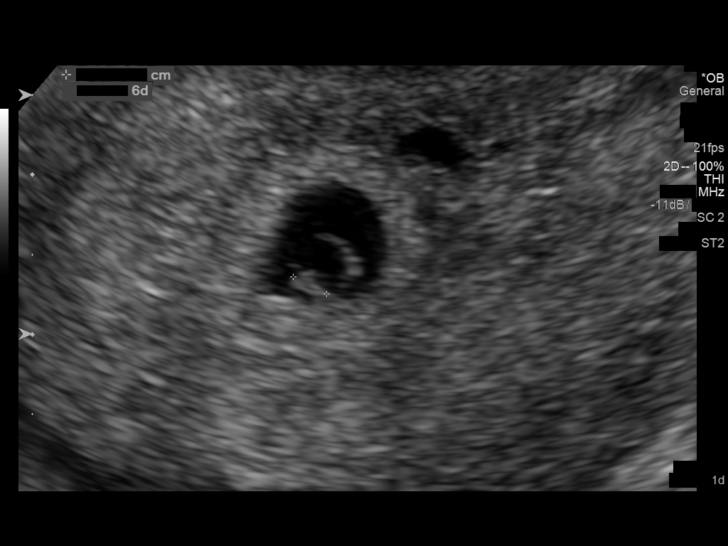
[im 61/92]
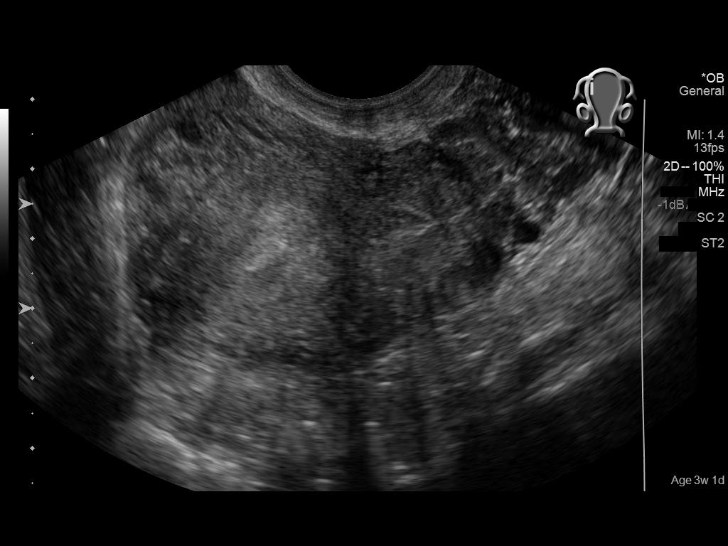
[im 68/92]
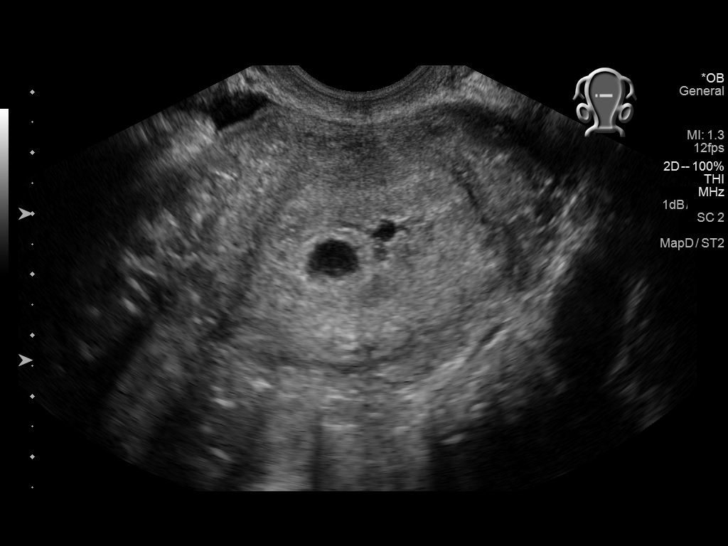
[im 75/92]
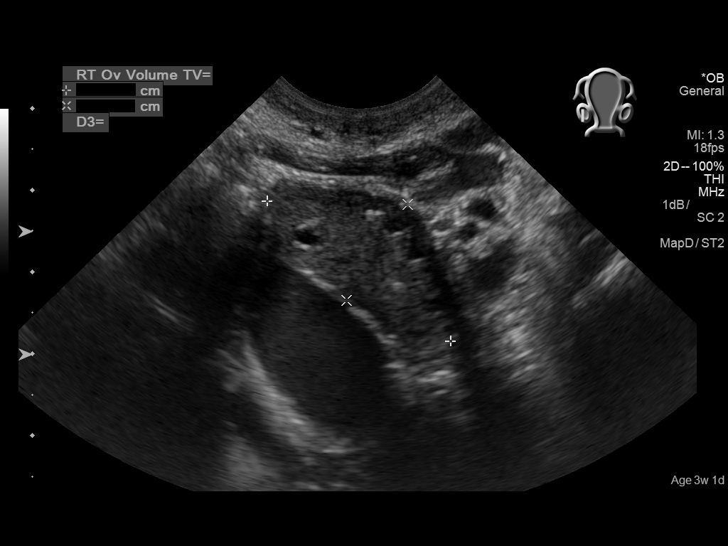
[im 81/92]
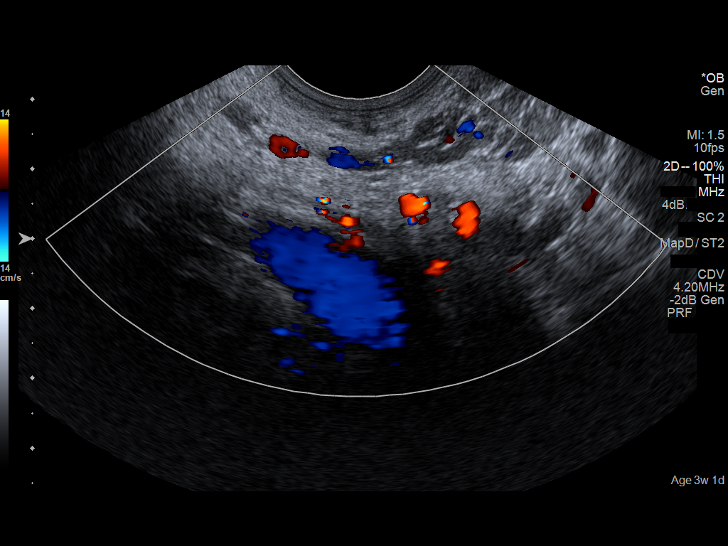
[im 88/92]
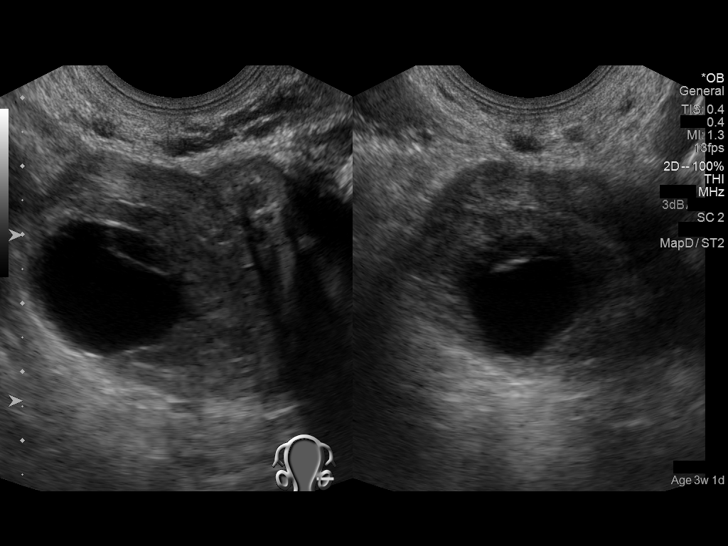

[13 of 28 positions shown; findings below may reference images not displayed]

FINDINGS: Intrauterine gestational sac: Single

Yolk sac:  Visualized.

Embryo:  Visualized.

Cardiac Activity: Not Visualized.

CRL:  3  mm   5 w   6 d                  US EDC: 05/16/2017

Subchorionic hemorrhage:  Tiny subchorionic hemorrhage noted.

Maternal uterus/adnexae: Small left ovarian corpus luteum cyst
noted. Normal appearance of right ovary. No suspicious adnexal
masses or abnormal free fluid identified.
IMPRESSION: Single IUP measuring 5 weeks 6 days, without definite embryonic
cardiac activity. Findings raise suspicion but are not definitive
for failed pregnancy. Recommend follow-up US in 10 days for
definitive diagnosis. This recommendation follows SRU consensus
guidelines: Diagnostic Criteria for Nonviable Pregnancy Early in the
First Trimester. N Engl J Med 1130; [DATE].

## 2017-12-18 IMAGING — US US OB TRANSVAGINAL
1 series · 14 of 28 positions shown · non-contrast
Comparison: 09/19/2016

CLINICAL DATA: First trimester pregnancy, uncertain viability on
prior study ; EGA 7 weeks 5 days by first ultrasound

EXAM:
OBSTETRIC <14 WK US AND TRANSVAGINAL OB US
TECHNIQUE: Both transabdominal and transvaginal ultrasound examinations were
performed for complete evaluation of the gestation as well as the
maternal uterus, adnexal regions, and pelvic cul-de-sac.
Transvaginal technique was performed to assess early pregnancy.

[Series 1: us ob transvaginal · 0.12mm/px · 14 of 192 slices shown]
[im 8/192]
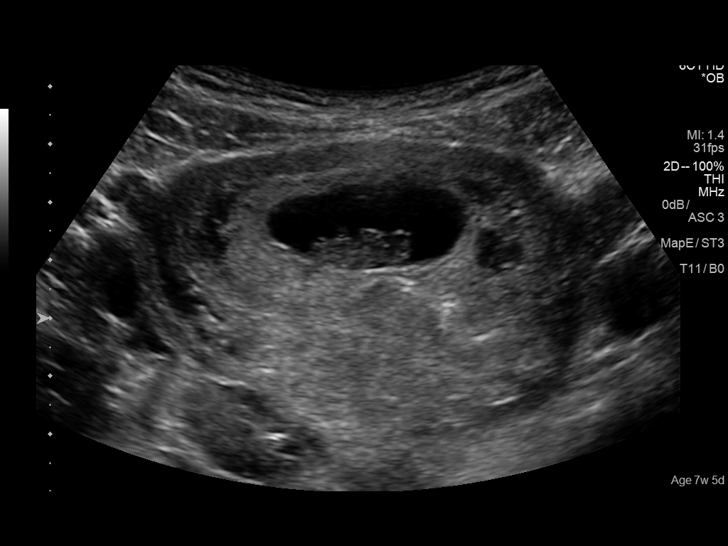
[im 22/192]
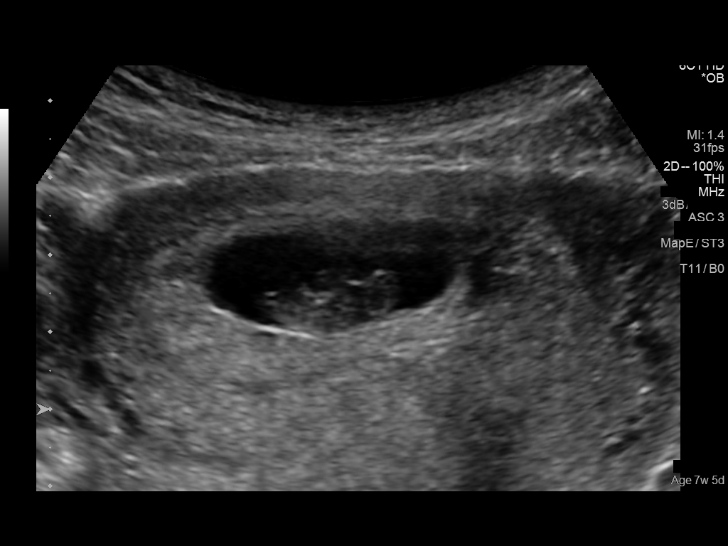
[im 36/192]
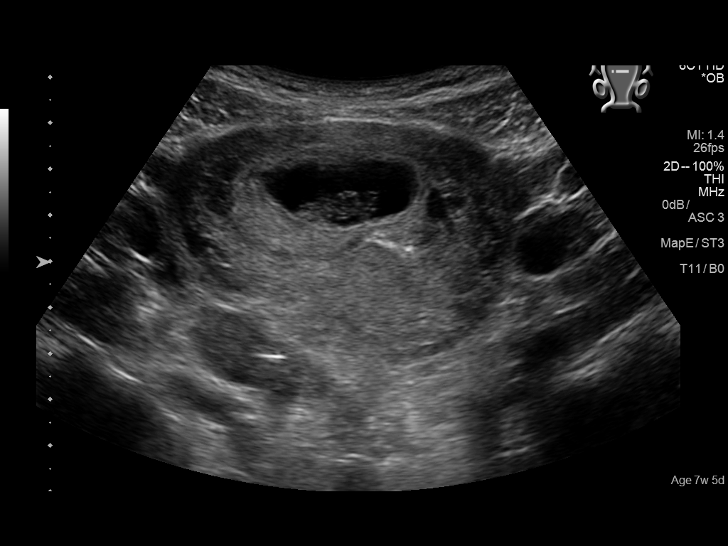
[im 50/192]
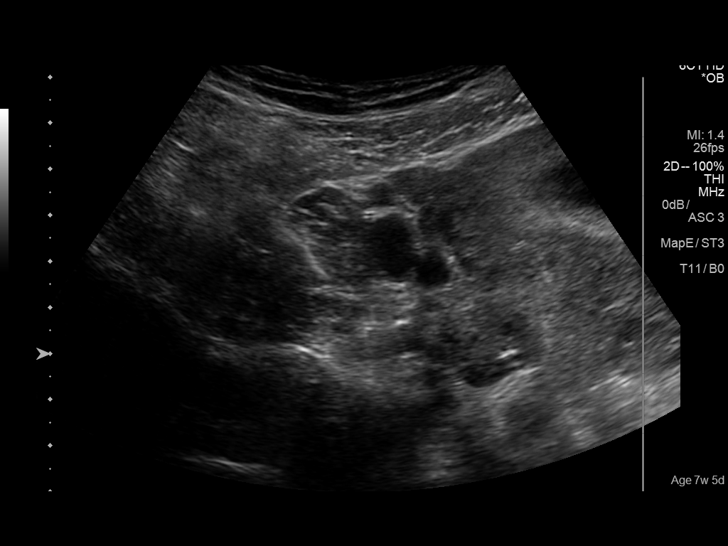
[im 64/192]
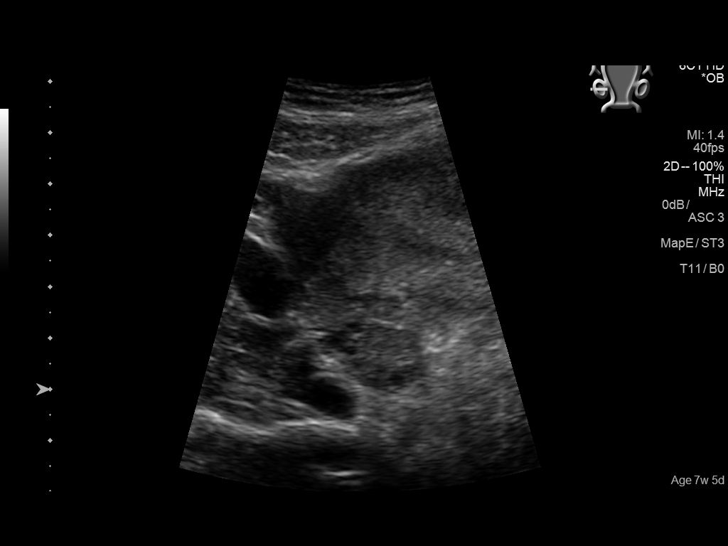
[im 78/192]
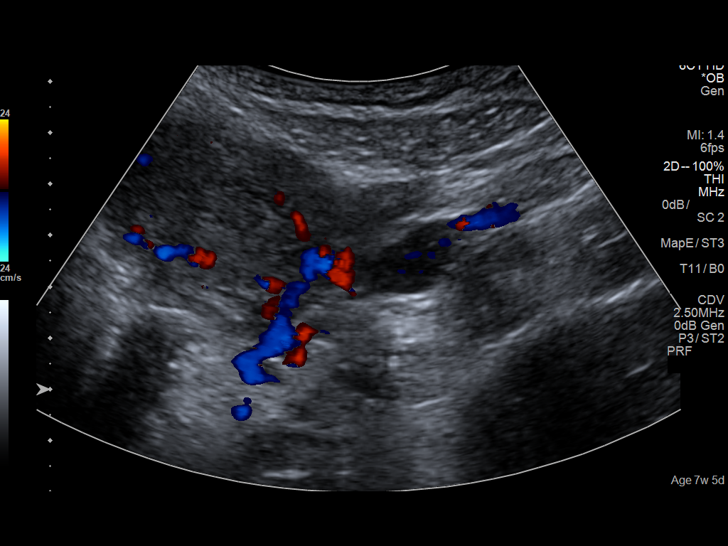
[im 92/192]
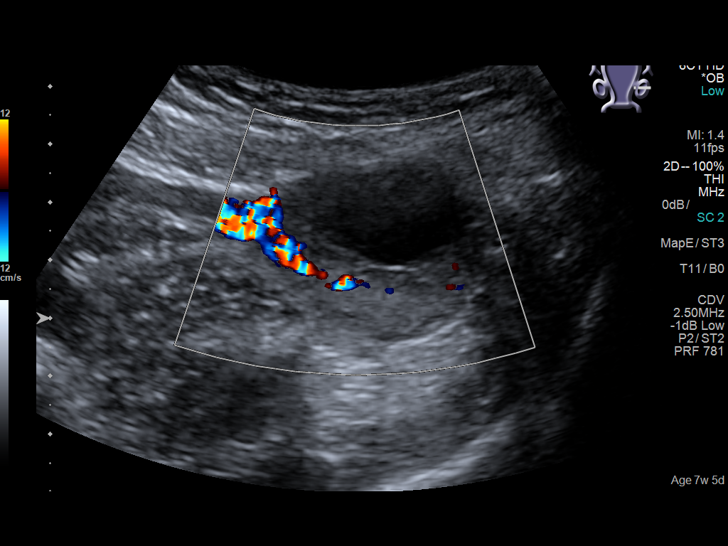
[im 107/192]
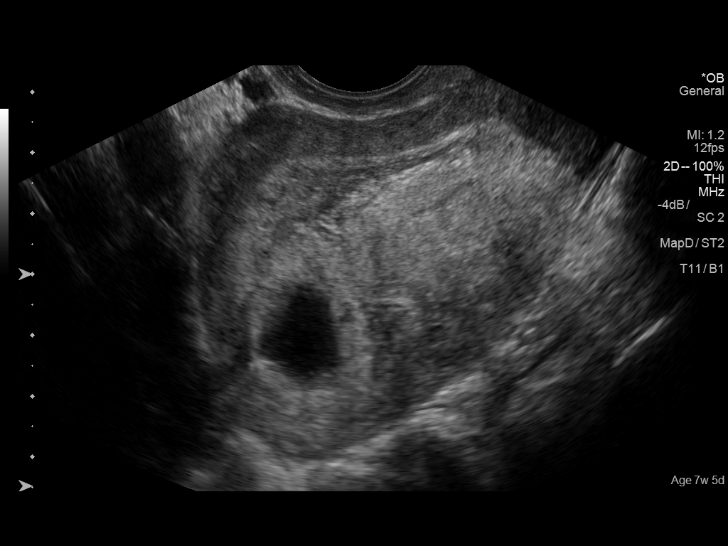
[im 121/192]
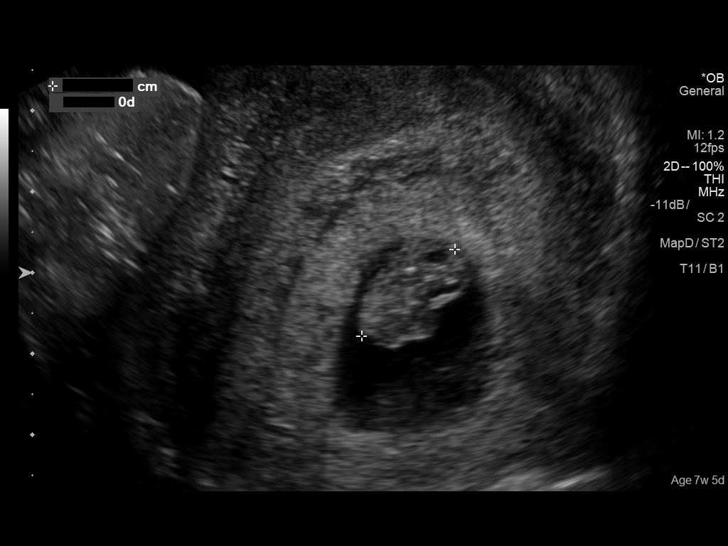
[im 135/192]
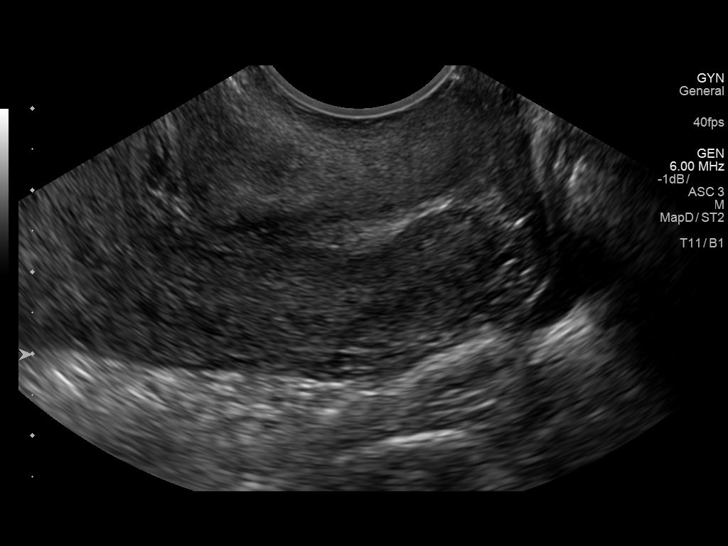
[im 149/192]
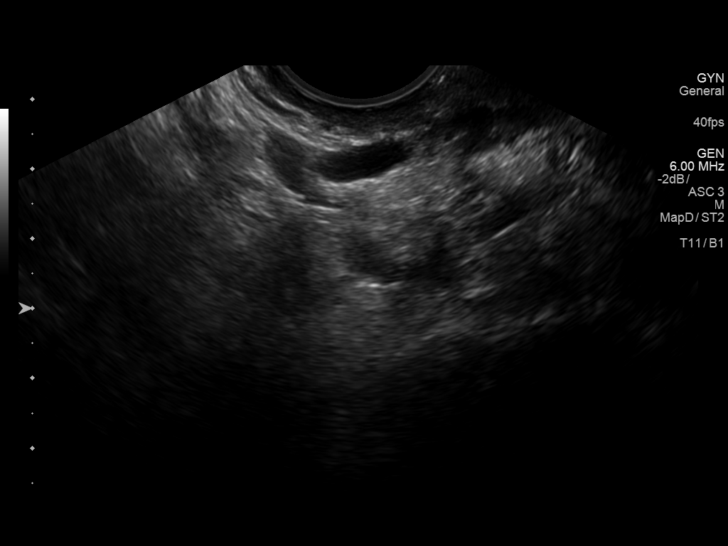
[im 163/192]
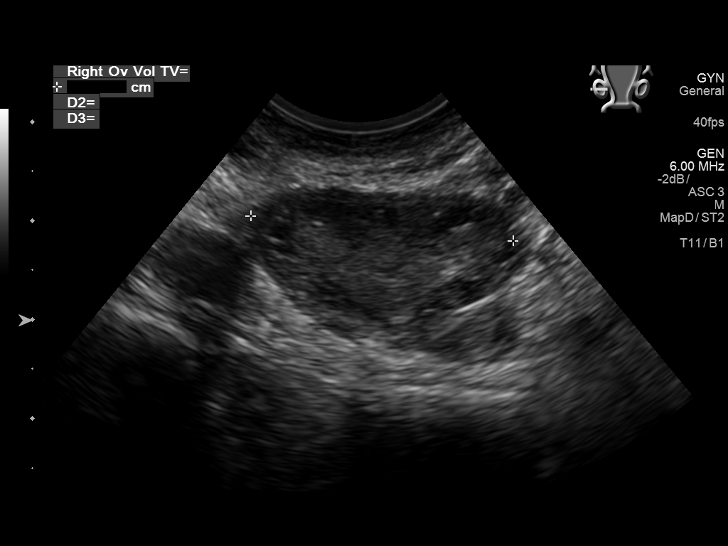
[im 177/192]
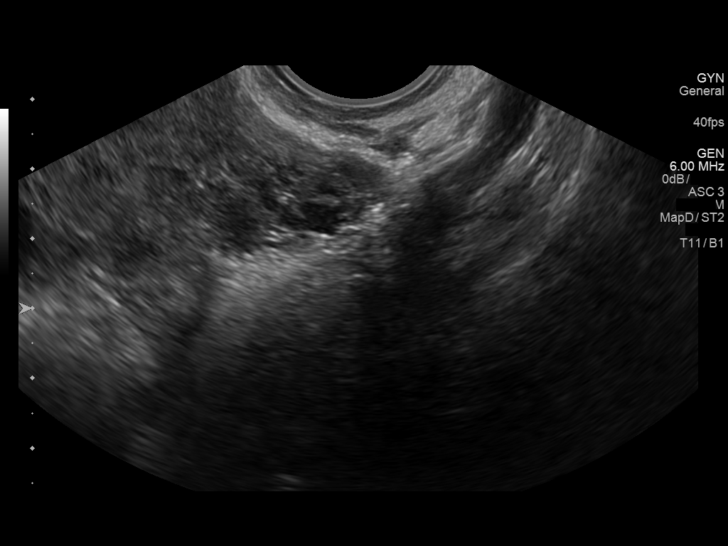
[im 192/192]
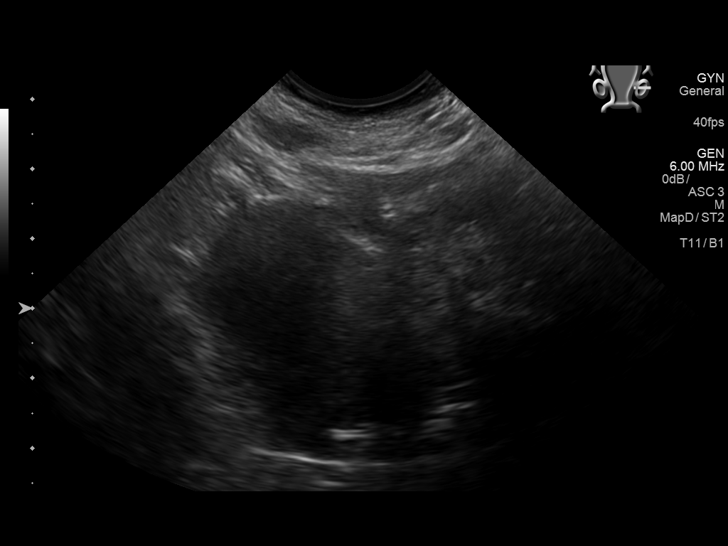

[14 of 28 positions shown; findings below may reference images not displayed]

FINDINGS: Intrauterine gestational sac: Present

Yolk sac:  Present

Embryo:  Present

Cardiac Activity: Present

Heart Rate: 158  bpm

CRL:  15.7  mm   8 w   0 d                  US EDC: 05/15/2017

Subchorionic hemorrhage:  Small subchronic hemorrhage

Maternal uterus/adnexae:

RIGHT ovary normal size and morphology, 2.7 x 3.0 x 1.2 cm

LEFT ovary measures 5.0 x 3.4 x 2.9 cm and contains a corpus luteal
cyst.

No additional adnexal masses.

Trace free pelvic fluid.
IMPRESSION: Single live intrauterine gestation as above.

Small subchronic hemorrhage.

## 2017-12-21 ENCOUNTER — Encounter (HOSPITAL_COMMUNITY): Payer: Self-pay

## 2018-10-13 ENCOUNTER — Emergency Department: Payer: Medicaid Other

## 2018-10-13 ENCOUNTER — Other Ambulatory Visit: Payer: Self-pay

## 2018-10-13 ENCOUNTER — Encounter: Payer: Self-pay | Admitting: Emergency Medicine

## 2018-10-13 ENCOUNTER — Emergency Department
Admission: EM | Admit: 2018-10-13 | Discharge: 2018-10-14 | Disposition: A | Discharge status: Home / Self Care | Payer: Medicaid Other | Attending: Student in an Organized Health Care Education/Training Program | Admitting: Student in an Organized Health Care Education/Training Program

## 2018-10-13 DIAGNOSIS — R42 Dizziness and giddiness: Secondary | ICD-10-CM | POA: Diagnosis not present

## 2018-10-13 DIAGNOSIS — R0602 Shortness of breath: Secondary | ICD-10-CM | POA: Insufficient documentation

## 2018-10-13 DIAGNOSIS — R51 Headache: Secondary | ICD-10-CM | POA: Insufficient documentation

## 2018-10-13 DIAGNOSIS — Z79899 Other long term (current) drug therapy: Secondary | ICD-10-CM | POA: Insufficient documentation

## 2018-10-13 HISTORY — DX: Iron deficiency anemia, unspecified: D50.9

## 2018-10-13 LAB — URINALYSIS, COMPLETE (UACMP) WITH MICROSCOPIC
Bacteria, UA: NONE SEEN
Bilirubin Urine: NEGATIVE
Glucose, UA: NEGATIVE mg/dL
Ketones, ur: 20 mg/dL — AB
Leukocytes,Ua: NEGATIVE
Nitrite: NEGATIVE
Protein, ur: 30 mg/dL — AB
Specific Gravity, Urine: 1.029 (ref 1.005–1.030)
pH: 5 (ref 5.0–8.0)

## 2018-10-13 LAB — POCT PREGNANCY, URINE: Preg Test, Ur: NEGATIVE

## 2018-10-13 NOTE — ED Notes (Signed)
Pt to door; reports breast feeding new infant and inquiry for treatment timeline

## 2018-10-13 NOTE — ED Notes (Signed)
EMS pt with c/o headache; Sharp pain; has had for "a long time"; 178/107, 146/110; hr 110, temp 98.8; pt told EMS she thinks something is wrong and she needs a CT or MRI; pt sitting in wheelchair in waiting room texting on phone at this time, waiting for triage;

## 2018-10-13 NOTE — ED Notes (Signed)
Lab called for blood not resulted; lab has no specimen; tech that drew blood gone home, pt interviewed: reports blood collected and pt has right AC bandaged; triage RNs questioned and triage area searched for blood   Isabelle Course, EDT, will recollect blood

## 2018-10-13 NOTE — ED Triage Notes (Signed)
Patient brought in by ems from home. Patient with complaint of shortness of breath, dizzy and shaking that started earlier today but has become worse. Patient states that she does not think she had a fever.

## 2018-10-14 LAB — CBC
HCT: 40 % (ref 36.0–46.0)
Hemoglobin: 13.2 g/dL (ref 12.0–15.0)
MCH: 27.4 pg (ref 26.0–34.0)
MCHC: 33 g/dL (ref 30.0–36.0)
MCV: 83 fL (ref 80.0–100.0)
Platelets: 202 10*3/uL (ref 150–400)
RBC: 4.82 MIL/uL (ref 3.87–5.11)
RDW: 14.6 % (ref 11.5–15.5)
WBC: 8.8 10*3/uL (ref 4.0–10.5)
nRBC: 0 % (ref 0.0–0.2)

## 2018-10-14 LAB — COMPREHENSIVE METABOLIC PANEL
ALT: 63 U/L — ABNORMAL HIGH (ref 0–44)
AST: 58 U/L — ABNORMAL HIGH (ref 15–41)
Albumin: 4.6 g/dL (ref 3.5–5.0)
Alkaline Phosphatase: 70 U/L (ref 38–126)
Anion gap: 11 (ref 5–15)
BUN: 12 mg/dL (ref 6–20)
CO2: 22 mmol/L (ref 22–32)
Calcium: 9.2 mg/dL (ref 8.9–10.3)
Chloride: 106 mmol/L (ref 98–111)
Creatinine, Ser: 0.78 mg/dL (ref 0.44–1.00)
GFR calc Af Amer: >60 mL/min (ref >60)
GFR calc non Af Amer: >60 mL/min (ref >60)
Glucose, Bld: 89 mg/dL (ref 70–99)
Potassium: 3.5 mmol/L (ref 3.5–5.1)
Sodium: 139 mmol/L (ref 135–145)
Total Bilirubin: 1 mg/dL (ref 0.3–1.2)
Total Protein: 7.7 g/dL (ref 6.5–8.1)

## 2018-10-14 LAB — TROPONIN I: Troponin I: <0.03 ng/mL (ref <0.03)

## 2018-10-14 NOTE — ED Provider Notes (Signed)
[facility] Emergency Department Provider Note    First MD Initiated Contact with Patient 10/13/18 2306     (approximate)  I have reviewed the triage vital signs and the nursing notes.   HISTORY  Chief Complaint Shortness of Breath and Dizziness    HPI Dawn Skinner is a 28 y.o. female below listed past medical history presents the ER for evaluation of several months of intermittent dizziness as well as daily headaches.  Denies any numbness or tingling.  No chest pain or shortness of breath.  Denies any symptoms at this time.  She is currently breast-feeding.  Denies any chest pain or shortness of breath.  No nausea or vomiting.  No blurry vision.   She is concerned that her blood pressure was elevated.  Denies any history of elevated blood pressure.   Past Medical History:  Diagnosis Date   Iron deficiency anemia    No family history on file. Past Surgical History:  Procedure Laterality Date   NO PAST SURGERIES     Patient Active Problem List   Diagnosis Date Noted   Hyperemesis affecting pregnancy, antepartum 05/21/2017      Prior to Admission medications   Medication Sig Start Date End Date Taking? Authorizing Provider  Doxylamine-Pyridoxine (DICLEGIS) 10-10 MG TBEC Take 2 tablets by mouth at bedtime. If symptoms persist, add one tablet in the morning and one in the afternoon 05/24/17   Oswaldo Conroy, CNM  famotidine (PEPCID) 20 MG tablet Take 1 tablet (20 mg total) by mouth 2 (two) times daily. 05/24/17   Oswaldo Conroy, CNM  metoCLOPramide (REGLAN) 10 MG tablet Take 1 tablet (10 mg total) by mouth 3 (three) times daily before meals. 05/24/17   Oswaldo Conroy, CNM  ondansetron (ZOFRAN ODT) 8 MG disintegrating tablet Take 1 tablet (8 mg total) by mouth every 8 (eight) hours as needed for nausea or vomiting. Patient not taking: Reported on 10/14/2016 10/03/16   Hassan Rowan, MD  ondansetron (ZOFRAN) 4 MG tablet Take 1 tablet (4 mg total)  by mouth every 4 (four) hours as needed for nausea or vomiting. 05/24/17   Oswaldo Conroy, CNM  Prenatal Vit-Fe Fumarate-FA (PRENATAL ONE DAILY) 27-0.8 MG TABS Take 1 tablet by mouth daily. Patient not taking: Reported on 04/27/2017 10/03/16   Hassan Rowan, MD    Allergies Patient has no known allergies.    Social History Social History   Tobacco Use   Smoking status: Never Smoker   Smokeless tobacco: Never Used  Substance Use Topics   Alcohol use: Yes    Comment: occ   Drug use: No    Review of Systems Patient denies headaches, rhinorrhea, blurry vision, numbness, shortness of breath, chest pain, edema, cough, abdominal pain, nausea, vomiting, diarrhea, dysuria, fevers, rashes or hallucinations unless otherwise stated above in HPI. ____________________________________________   PHYSICAL EXAM:  VITAL SIGNS: Vitals:   10/13/18 2137  BP: (!) 134/98  Pulse: 91  Resp: 18  Temp: 98.8 F (37.1 C)  SpO2: 98%    Constitutional: Alert and oriented.  Eyes: Conjunctivae are normal.  Head: Atraumatic. Nose: No congestion/rhinnorhea. Mouth/Throat: Mucous membranes are moist.   Neck: No stridor. Painless ROM.  Cardiovascular: Normal rate, regular rhythm. Grossly normal heart sounds.  Good peripheral circulation. Respiratory: Normal respiratory effort.  No retractions. Lungs CTAB. Gastrointestinal: Soft and nontender. No distention. No abdominal bruits. No CVA tenderness. Genitourinary:  Musculoskeletal: No lower extremity tenderness nor edema.  No joint effusions. Neurologic:  CN-  intact.  No facial droop, Normal FNF.  Normal heel to shin.  Sensation intact bilaterally. Normal speech and language. No gross focal neurologic deficits are appreciated. No gait instability. Skin:  Skin is warm, dry and intact. No rash noted. Psychiatric: Mood and affect are normal. Speech and behavior are normal.  ____________________________________________   LABS (all labs ordered are  listed, but only abnormal results are displayed)  Results for orders placed or performed during the hospital encounter of 10/13/18 (from the past 24 hour(s))  CBC     Status: None (Preliminary result)   Collection Time: 10/13/18 10:12 PM  Result Value Ref Range   WBC PENDING 4.0 - 10.5 K/uL   RBC 4.82 3.87 - 5.11 MIL/uL   Hemoglobin 13.2 12.0 - 15.0 g/dL   HCT 16.140.0 09.636.0 - 04.546.0 %   MCV 83.0 80.0 - 100.0 fL   MCH 27.4 26.0 - 34.0 pg   MCHC 33.0 30.0 - 36.0 g/dL   RDW 40.914.6 81.111.5 - 91.415.5 %   Platelets 202 150 - 400 K/uL   nRBC PENDING 0.0 - 0.2 %  Urinalysis, Complete w Microscopic     Status: Abnormal   Collection Time: 10/13/18 10:12 PM  Result Value Ref Range   Color, Urine YELLOW (A) YELLOW   APPearance HAZY (A) CLEAR   Specific Gravity, Urine 1.029 1.005 - 1.030   pH 5.0 5.0 - 8.0   Glucose, UA NEGATIVE NEGATIVE mg/dL   Hgb urine dipstick SMALL (A) NEGATIVE   Bilirubin Urine NEGATIVE NEGATIVE   Ketones, ur 20 (A) NEGATIVE mg/dL   Protein, ur 30 (A) NEGATIVE mg/dL   Nitrite NEGATIVE NEGATIVE   Leukocytes,Ua NEGATIVE NEGATIVE   RBC / HPF 0-5 0 - 5 RBC/hpf   WBC, UA 0-5 0 - 5 WBC/hpf   Bacteria, UA NONE SEEN NONE SEEN   Squamous Epithelial / LPF 0-5 0 - 5   Mucus PRESENT    Hyaline Casts, UA PRESENT   Comprehensive metabolic panel     Status: Abnormal   Collection Time: 10/13/18 10:12 PM  Result Value Ref Range   Sodium 139 135 - 145 mmol/L   Potassium 3.5 3.5 - 5.1 mmol/L   Chloride 106 98 - 111 mmol/L   CO2 22 22 - 32 mmol/L   Glucose, Bld 89 70 - 99 mg/dL   BUN 12 6 - 20 mg/dL   Creatinine, Ser 7.820.78 0.44 - 1.00 mg/dL   Calcium 9.2 8.9 - 95.610.3 mg/dL   Total Protein 7.7 6.5 - 8.1 g/dL   Albumin 4.6 3.5 - 5.0 g/dL   AST 58 (H) 15 - 41 U/L   ALT 63 (H) 0 - 44 U/L   Alkaline Phosphatase 70 38 - 126 U/L   Total Bilirubin 1.0 0.3 - 1.2 mg/dL   GFR calc non Af Amer >60 >60 mL/min   GFR calc Af Amer >60 >60 mL/min   Anion gap 11 5 - 15  Troponin I - ONCE - STAT      Status: None   Collection Time: 10/13/18 10:12 PM  Result Value Ref Range   Troponin I <0.03 <0.03 ng/mL  Pregnancy, urine POC     Status: None   Collection Time: 10/13/18 10:23 PM  Result Value Ref Range   Preg Test, Ur NEGATIVE NEGATIVE   ____________________________________________  EKG My review and personal interpretation at Time: 21:33   Indication: dizziness  Rate: 80  Rhythm: sinus Axis: normal Other: normal intervals, no stemi ____________________________________________  RADIOLOGY  I personally reviewed all radiographic images ordered to evaluate for the above acute complaints and reviewed radiology reports and findings.  These findings were personally discussed with the patient.  Please see medical record for radiology report.  ____________________________________________   PROCEDURES  Procedure(s) performed:  Procedures    Critical Care performed: no ____________________________________________   INITIAL IMPRESSION / ASSESSMENT AND PLAN / ED COURSE  Pertinent labs & imaging results that were available during my care of the patient were reviewed by me and considered in my medical decision making (see chart for details).   DDX: Dehydration, electrolyte abnormality, pregnancy, dysrhythmia, anemia  Vincenzina Jagoda is a 28 y.o. who presents to the ED with symptoms as described above.  She is afebrile hemodynamically stable.  Mildly elevated blood pressure.  Her neuro exam is nonfocal and reassuring.  Do not feel that emergent neuroimaging clinically indicated given duration of symptoms.  Her blood work is reassuring there is mild elevation of her LFTs but no biliary elevation.  Denies any epigastric discomfort and her abdominal exam is soft and benign.  No evidence or history to suggest medication reaction.  Low risk wells and perc negative. She does not have any pain or discomfort at this time.  Patient stated that she needed to leave to go nurse her baby is with  family member in the car.  We discussed reassuring work-up.  We discussed needing to follow-up with PCP regarding her elevated blood pressure as well as follow-up with neurology as headache persists.  Have discussed with the patient and available family all diagnostics and treatments performed thus far and all questions were answered to the best of my ability. The patient demonstrates understanding and agreement with plan.      The patient was evaluated in Emergency Department today for the symptoms described in the history of present illness. He/she was evaluated in the context of the global COVID-19 pandemic, which necessitated consideration that the patient might be at risk for infection with the SARS-CoV-2 virus that causes COVID-19. Institutional protocols and algorithms that pertain to the evaluation of patients at risk for COVID-19 are in a state of rapid change based on information released by regulatory bodies including the CDC and federal and state organizations. These policies and algorithms were followed during the patient's care in the ED.  As part of my medical decision making, I reviewed the following data within the electronic MEDICAL RECORD NUMBER Nursing notes reviewed and incorporated, Labs reviewed, notes from prior ED visits and [place] Controlled Substance Database   ____________________________________________   FINAL CLINICAL IMPRESSION(S) / ED DIAGNOSES  Final diagnoses:  Dizziness      NEW MEDICATIONS STARTED DURING THIS VISIT:  New Prescriptions   No medications on file     Note:  This document was prepared using Dragon voice recognition software and may include unintentional dictation errors.    Willy Eddy, MD 10/14/18 [phone]

## 2018-10-14 NOTE — ED Notes (Signed)
Pt to ED via EMS with c/o HA for greater than 24 hours, denies taking taking any meds, pt reports her 66 month old baby is in the car with her father and she needs to return to the baby to breastfeed  A&O, no neuro deficits, NAD

## 2018-10-14 NOTE — ED Notes (Signed)
No peripheral IV placed this visit.   Discharge instructions reviewed with patient. Questions fielded by this RN. Patient verbalizes understanding of instructions. Patient discharged home in stable condition per robinson. No acute distress noted at time of discharge.    

## 2018-10-30 ENCOUNTER — Ambulatory Visit
Admission: EM | Admit: 2018-10-30 | Discharge: 2018-10-30 | Disposition: A | Discharge status: Home / Self Care | Payer: Medicaid Other | Attending: Emergency Medicine | Admitting: Emergency Medicine

## 2018-10-30 ENCOUNTER — Encounter: Payer: Self-pay | Admitting: Emergency Medicine

## 2018-10-30 ENCOUNTER — Other Ambulatory Visit: Payer: Self-pay

## 2018-10-30 DIAGNOSIS — I10 Essential (primary) hypertension: Secondary | ICD-10-CM | POA: Diagnosis not present

## 2018-10-30 DIAGNOSIS — R42 Dizziness and giddiness: Secondary | ICD-10-CM | POA: Insufficient documentation

## 2018-10-30 MED ORDER — HYDROCHLOROTHIAZIDE 12.5 MG PO TABS: 12.5000 mg | ORAL_TABLET | Freq: Every day | ORAL | 1 refills | Status: DC

## 2018-10-30 MED ORDER — CETIRIZINE HCL 10 MG PO TABS: 10.0000 mg | ORAL_TABLET | Freq: Every day | ORAL | Status: DC

## 2018-10-30 MED ORDER — MECLIZINE HCL 12.5 MG PO TABS: 25.0000 mg | ORAL_TABLET | Freq: Three times a day (TID) | ORAL | 0 refills | Status: DC | PRN

## 2018-10-30 MED ORDER — FLUTICASONE PROPIONATE 50 MCG/ACT NA SUSP: 1.0000 | Freq: Every day | NASAL | 2 refills | Status: DC

## 2018-10-30 NOTE — ED Triage Notes (Signed)
Patient c/o elevated blood pressure and HAs that has been going on for couple of weeks.  Patient states that she was seen at the ED on 5/31 for dizziness.  Patient denies fevers.

## 2018-10-30 NOTE — ED Provider Notes (Signed)
[facility]    CSN: [id number] Arrival date & time: 10/30/18  1317     History   Chief Complaint Chief Complaint  Patient presents with  . Hypertension    HPI Rendy Lazard is a [id number] 28 y.o. female presenting with dizziness, chest pain and overall 'not feeling well'. Pt was seen in ER for similar complaints <1 month ago and was told to follow-up with PCP. Pt is originally from [place] and has yet to received insurance and a PCP in this state. Today, pt states she never felt better after her first ER visit and now has chest pain and body aches, ringing in her right ear, poor appetite and occasional nausea. She tried the recommended lifestyle changes of better diet, increased physical activity, but did not notice a change to her symptoms. She is currently breast-feeding, but has no other major life changes of note. Pertinent history includes gestational hypertension with her most recent pregnancy that was managed with lifestyle changes Pt denies fever, shortness of breath and contact with anyone with known COVID-19.   Past Medical History:  Diagnosis Date  . Iron deficiency anemia     Patient Active Problem List   Diagnosis Date Noted  . Hyperemesis affecting pregnancy, antepartum 05/21/2017    Past Surgical History:  Procedure Laterality Date  . NO PAST SURGERIES      OB History    Gravida  3   Para  2   Term  2   Preterm      AB      Living  2     SAB      TAB      Ectopic      Multiple      Live Births  2            Home Medications    Prior to Admission medications   Medication Sig Start Date End Date Taking? Authorizing Provider  Prenatal Vit-Fe Fumarate-FA (PRENATAL ONE DAILY) 27-0.8 MG TABS Take 1 tablet by mouth daily. 10/03/16  Yes Frederich Cha, MD  cetirizine (ZYRTEC) 10 MG tablet Take 1 tablet (10 mg total) by mouth daily. 10/30/18   Gertie Baron, NP  Doxylamine-Pyridoxine (DICLEGIS) 10-10 MG TBEC Take 2 tablets by  mouth at bedtime. If symptoms persist, add one tablet in the morning and one in the afternoon 05/24/17   Rexene Agent, CNM  famotidine (PEPCID) 20 MG tablet Take 1 tablet (20 mg total) by mouth 2 (two) times daily. 05/24/17   Rexene Agent, CNM  fluticasone (FLONASE) 50 MCG/ACT nasal spray Place 1 spray into both nostrils daily. 10/30/18   Gertie Baron, NP  hydrochlorothiazide (HYDRODIURIL) 12.5 MG tablet Take 1 tablet (12.5 mg total) by mouth daily. 10/30/18   Gertie Baron, NP  meclizine (ANTIVERT) 12.5 MG tablet Take 2 tablets (25 mg total) by mouth 3 (three) times daily as needed for dizziness. 10/30/18   Gertie Baron, NP  metoCLOPramide (REGLAN) 10 MG tablet Take 1 tablet (10 mg total) by mouth 3 (three) times daily before meals. 05/24/17   Rexene Agent, CNM  ondansetron (ZOFRAN ODT) 8 MG disintegrating tablet Take 1 tablet (8 mg total) by mouth every 8 (eight) hours as needed for nausea or vomiting. Patient not taking: Reported on 10/14/2016 10/03/16   Frederich Cha, MD  ondansetron (ZOFRAN) 4 MG tablet Take 1 tablet (4 mg total) by mouth every 4 (four) hours as needed for nausea or vomiting. 05/24/17   Avel Sensor  Y, CNM    Family History Family History  Problem Relation Age of Onset  . Hypertension Mother   . Hypertension Father     Social History Social History   Tobacco Use  . Smoking status: Never Smoker  . Smokeless tobacco: Never Used  Substance Use Topics  . Alcohol use: Yes    Comment: occ  . Drug use: No     Allergies   Patient has no known allergies.   Review of Systems Review of Systems  Constitutional: Positive for appetite change and fatigue. Negative for chills, fever and unexpected weight change.  HENT: Positive for ear pain and tinnitus. Negative for facial swelling, rhinorrhea, sinus pressure, sneezing and sore throat.   Eyes: Negative for photophobia, pain, discharge and itching.  Respiratory: Negative for cough and shortness of  breath.   Cardiovascular: Positive for chest pain. Negative for palpitations and leg swelling.  Gastrointestinal: Positive for nausea. Negative for abdominal pain, diarrhea and vomiting.  Endocrine: Negative for cold intolerance and heat intolerance.  Musculoskeletal: Negative for back pain.  Skin: Negative for color change.  Neurological: Positive for weakness and headaches. Negative for dizziness, tremors, light-headedness and numbness.     Physical Exam Triage Vital Signs ED Triage Vitals  Enc Vitals Group     BP 10/30/18 1333 (!) 146/107     Pulse Rate 10/30/18 1333 72     Resp 10/30/18 1333 14     Temp 10/30/18 1333 98.2 F (36.8 C)     Temp Source 10/30/18 1333 Oral     SpO2 10/30/18 1333 100 %     Weight 10/30/18 1330 175 lb (79.4 kg)     Height 10/30/18 1330 5\' 6"  (1.676 m)     Head Circumference --      Peak Flow --      Pain Score 10/30/18 1330 5     Pain Loc --      Pain Edu? --      Excl. in GC? --    No data found.  Updated Vital Signs BP (!) 140/96 (BP Location: Right Arm)   Pulse 72   Temp 98.2 F (36.8 C) (Oral)   Resp 14   Ht 5\' 6"  (1.676 m)   Wt 175 lb (79.4 kg)   LMP 10/03/2018 (Exact Date)   SpO2 100%   Breastfeeding Yes   BMI 28.25 kg/m   Visual Acuity Right Eye Distance:   Left Eye Distance:   Bilateral Distance:    Right Eye Near:   Left Eye Near:    Bilateral Near:     Physical Exam Vitals signs and nursing note reviewed.  Constitutional:      Appearance: Normal appearance.  HENT:     Right Ear: Tympanic membrane normal.     Left Ear: Tympanic membrane normal.  Eyes:     Extraocular Movements: Extraocular movements intact.     Right eye: Nystagmus present.     Left eye: Nystagmus present.     Pupils: Pupils are equal, round, and reactive to light.     Comments: Nystagmus with left lateral eye movement   Cardiovascular:     Rate and Rhythm: Normal rate and regular rhythm.     Pulses: Normal pulses.     Heart sounds: Normal  heart sounds.  Pulmonary:     Effort: Pulmonary effort is normal.     Breath sounds: Normal breath sounds.  Skin:    General: Skin is warm and dry.  Neurological:  Mental Status: She is alert.  Psychiatric:        Mood and Affect: Mood normal.        Thought Content: Thought content normal.        Judgment: Judgment normal.      UC Treatments / Results  Labs (all labs ordered are listed, but only abnormal results are displayed) Labs Reviewed - No data to display  EKG None  Radiology No results found.  Procedures ED EKG  Date/Time: 10/30/2018 2:28 PM Performed by: Bailey MechBenjamin, Colbie Sliker, NP Authorized by: Bailey MechBenjamin, Rondarius Kadrmas, NP   ECG reviewed by ED Physician in the absence of a cardiologist: yes   Previous ECG:    Previous ECG:  Compared to current   Comparison ECG info:  Increased artifiact   Similarity:  No change Interpretation:    Interpretation: normal   Rate:    ECG rate assessment: normal   Rhythm:    Rhythm: sinus rhythm   Ectopy:    Ectopy: none   QRS:    QRS axis:  Normal Conduction:    Conduction: normal   Comments:     Occasional sinus arrhythmia   (including critical care time)  Medications Ordered in UC Medications - No data to display  Initial Impression / Assessment and Plan / UC Course  I have reviewed the triage vital signs and the nursing notes.  Pertinent labs & imaging results that were available during my care of the patient were reviewed by me and considered in my medical decision making (see chart for details).   Pt presents with concern of elevated blood pressure. Her symptoms are can be summarized as dizziness, chest pain and general malaise. EKG during this visit was WNL. Lateral nystagmus present. Pt diagnosed with hypertension and vertigo and treated as such with 12.5 HCTZ daily and 12.5 meclizine 3x a day as needed. Pt also instructed to take OTC antihistamines and a nasal steroid to aid in possible eustachian tube dysfunction.  Pt instructed to follow up with PCP as ASAP. Symptoms that require evaluation in an ED setting reviewed. All questions answered and all concerns addressed.   Final Clinical Impressions(s) / UC Diagnoses   Final diagnoses:  Essential hypertension  Vertigo   Discharge Instructions   None    ED Prescriptions    Medication Sig Dispense Auth. Provider   hydrochlorothiazide (HYDRODIURIL) 12.5 MG tablet Take 1 tablet (12.5 mg total) by mouth daily. 30 tablet Bailey MechBenjamin, Santhiago Collingsworth, NP   meclizine (ANTIVERT) 12.5 MG tablet Take 2 tablets (25 mg total) by mouth 3 (three) times daily as needed for dizziness. 30 tablet Bailey MechBenjamin, Miku Udall, NP   cetirizine (ZYRTEC) 10 MG tablet Take 1 tablet (10 mg total) by mouth daily.  Bailey MechBenjamin, Elysse Polidore, NP   fluticasone (FLONASE) 50 MCG/ACT nasal spray Place 1 spray into both nostrils daily.  Bailey MechBenjamin, Clevon Khader, NP        Bailey MechBenjamin, Lequan Dobratz, NP 10/30/18 1436

## 2018-11-06 DIAGNOSIS — I10 Essential (primary) hypertension: Secondary | ICD-10-CM | POA: Insufficient documentation

## 2018-11-06 DIAGNOSIS — R42 Dizziness and giddiness: Secondary | ICD-10-CM | POA: Insufficient documentation

## 2018-11-13 ENCOUNTER — Ambulatory Visit: Admit: 2018-11-13 | Payer: Self-pay

## 2018-11-13 ENCOUNTER — Ambulatory Visit
Admission: EM | Admit: 2018-11-13 | Discharge: 2018-11-13 | Disposition: A | Discharge status: Home / Self Care | Payer: Self-pay | Attending: Emergency Medicine | Admitting: Emergency Medicine

## 2018-11-13 ENCOUNTER — Ambulatory Visit
Admission: RE | Admit: 2018-11-13 | Discharge: 2018-11-13 | Disposition: A | Discharge status: Home / Self Care | Payer: Medicaid Other | Source: Ambulatory Visit | Attending: Emergency Medicine | Admitting: Emergency Medicine

## 2018-11-13 ENCOUNTER — Other Ambulatory Visit: Payer: Self-pay

## 2018-11-13 ENCOUNTER — Encounter: Payer: Self-pay | Admitting: Emergency Medicine

## 2018-11-13 DIAGNOSIS — R102 Pelvic and perineal pain: Secondary | ICD-10-CM | POA: Insufficient documentation

## 2018-11-13 DIAGNOSIS — R300 Dysuria: Secondary | ICD-10-CM

## 2018-11-13 LAB — URINALYSIS, COMPLETE (UACMP) WITH MICROSCOPIC
Glucose, UA: NEGATIVE mg/dL
Hgb urine dipstick: NEGATIVE
Nitrite: NEGATIVE
Specific Gravity, Urine: 1.02 (ref 1.005–1.030)
pH: 6.5 (ref 5.0–8.0)

## 2018-11-13 LAB — WET PREP, GENITAL
Clue Cells Wet Prep HPF POC: NONE SEEN
Sperm: NONE SEEN
Trich, Wet Prep: NONE SEEN
Yeast Wet Prep HPF POC: NONE SEEN

## 2018-11-13 LAB — PREGNANCY, URINE: Preg Test, Ur: NEGATIVE

## 2018-11-13 MED ORDER — CEPHALEXIN 500 MG PO CAPS: 500.0000 mg | ORAL_CAPSULE | Freq: Two times a day (BID) | ORAL | 0 refills | Status: AC

## 2018-11-13 NOTE — ED Provider Notes (Signed)
[facility] ____________________________________________  Time seen: Approximately 11:23 AM  I have reviewed the triage vital signs and the nursing notes.   HISTORY  Chief Complaint Pelvic Pain and Vaginal Discharge   HPI Dawn Skinner is a 28 y.o. female presenting for evaluation of pelvic discomfort present for the last 1 week.  Reports accompanying this she has noticed some cloudy urine and darker coloration of urine.  Denies urinary burning.  Also has had some whitish occasional odorous vaginal discharge.  Denies concerns of STDs.  States was recently tested for STDs 2 weeks ago and was negative.  Not very sexually active per patient.  Still nursing a 83-month-old healthy child per patient.  Receives Depo-Provera injections.  Awaiting her Medicaid to be changed to [place] to establish new primary.  States pelvic discomfort currently is mild, states it comes intermittently in waves, more on the left side.  Denies personal history of cysts or pelvic pain.  Does reports that her mother has a history of ovarian cyst.  Overall normal bowel habits, last bowel movement 2 days ago and described as normal.  No accompanying vomiting, diarrhea, cough, chest pain, shortness of breath, sore throat, fevers or recent sickness.  Continues to eat and drink well.  Reports otherwise doing well.  Denies aggravating alleviating factors.   Past Medical History:  Diagnosis Date  . Iron deficiency anemia   HTN  Patient Active Problem List   Diagnosis Date Noted  . Hyperemesis affecting pregnancy, antepartum 05/21/2017    Past Surgical History:  Procedure Laterality Date  . NO PAST SURGERIES       No current facility-administered medications for this encounter.   Current Outpatient Medications:  .  cetirizine (ZYRTEC) 10 MG tablet, Take 1 tablet (10 mg total) by mouth daily., Disp:  , Rfl:  .  famotidine (PEPCID) 20 MG tablet, Take 1 tablet (20 mg total) by mouth 2 (two) times daily.,  Disp: 60 tablet, Rfl: 2 .  fluticasone (FLONASE) 50 MCG/ACT nasal spray, Place 1 spray into both nostrils daily., Disp: , Rfl: 2 .  medroxyPROGESTERone (DEPO-PROVERA) 150 MG/ML injection, Inject 150 mg into the muscle every 3 (three) months., Disp: , Rfl:  .  Prenatal Vit-Fe Fumarate-FA (PRENATAL ONE DAILY) 27-0.8 MG TABS, Take 1 tablet by mouth daily., Disp: 30 tablet, Rfl: 0 .  cephALEXin (KEFLEX) 500 MG capsule, Take 1 capsule (500 mg total) by mouth 2 (two) times daily for 5 days., Disp: 10 capsule, Rfl: 0 .  Doxylamine-Pyridoxine (DICLEGIS) 10-10 MG TBEC, Take 2 tablets by mouth at bedtime. If symptoms persist, add one tablet in the morning and one in the afternoon, Disp: 100 tablet, Rfl: 5 .  hydrochlorothiazide (HYDRODIURIL) 12.5 MG tablet, Take 1 tablet (12.5 mg total) by mouth daily., Disp: 30 tablet, Rfl: 1 .  meclizine (ANTIVERT) 12.5 MG tablet, Take 2 tablets (25 mg total) by mouth 3 (three) times daily as needed for dizziness., Disp: 30 tablet, Rfl: 0 .  metoCLOPramide (REGLAN) 10 MG tablet, Take 1 tablet (10 mg total) by mouth 3 (three) times daily before meals., Disp: 90 tablet, Rfl: 3 .  ondansetron (ZOFRAN ODT) 8 MG disintegrating tablet, Take 1 tablet (8 mg total) by mouth every 8 (eight) hours as needed for nausea or vomiting. (Patient not taking: Reported on 10/14/2016), Disp: 20 tablet, Rfl: 0 .  ondansetron (ZOFRAN) 4 MG tablet, Take 1 tablet (4 mg total) by mouth every 4 (four) hours as needed for nausea or vomiting., Disp: 20 tablet, Rfl:  2  Allergies Patient has no known allergies.  Family History  Problem Relation Age of Onset  . Hypertension Mother   . Hypertension Father   Mother: Ovarian cyst.  Ovarian cancer.  Social History Social History   Tobacco Use  . Smoking status: Never Smoker  . Smokeless tobacco: Never Used  Substance Use Topics  . Alcohol use: Yes    Comment: occ  . Drug use: No    Review of Systems Constitutional: No fever ENT: No sore  throat. Cardiovascular: Denies chest pain. Respiratory: Denies shortness of breath. Gastrointestinal: No abdominal pain.  Positive pelvic discomfort.  No nausea, no vomiting.  No diarrhea.  No constipation. Genitourinary: Positive for dysuria. Musculoskeletal: Negative for back pain. Skin: Negative for rash. Neurological: Negative for headaches, focal weakness or numbness.   ____________________________________________   PHYSICAL EXAM:  VITAL SIGNS: ED Triage Vitals  Enc Vitals Group     BP 11/13/18 1018 (!) 122/92     Pulse Rate 11/13/18 1018 74     Resp 11/13/18 1018 16     Temp 11/13/18 1018 98.7 F (37.1 C)     Temp Source 11/13/18 1018 Oral     SpO2 11/13/18 1018 99 %     Weight 11/13/18 1017 179 lb (81.2 kg)     Height 11/13/18 1017 5\' 6"  (1.676 m)     Head Circumference --      Peak Flow --      Pain Score 11/13/18 1017 6     Pain Loc --      Pain Edu? --      Excl. in [place]? --     Constitutional: Alert and oriented. Well appearing and in no acute distress. ENT      Head: Normocephalic and atraumatic. Cardiovascular: Normal rate, regular rhythm. Grossly normal heart sounds.  Good peripheral circulation. Respiratory: Normal respiratory effort without tachypnea nor retractions. Breath sounds are clear and equal bilaterally. No wheezes, rales, rhonchi. Gastrointestinal:  No distention. Normal Bowel sounds.  Mild diffuse suprapubic pelvic discomfort, mild to moderate left suprapubic tenderness.  No CVA tenderness. Pelvic: Pelvic exam completed with Brittney CMA at bedside as chaperone. External: Normal appearance, no rash or lesion.  Patient elected for self wet prep which was completed.  Bimanual: No cervical motion tenderness.  Left adnexal tenderness. Musculoskeletal:  No midline cervical, thoracic or lumbar tenderness to palpation.  Neurologic:  Normal speech and language.Speech is normal. No gait instability.  Skin:  Skin is warm, dry and intact. No rash noted.  Psychiatric: Mood and affect are normal. Speech and behavior are normal. Patient exhibits appropriate insight and judgment   ___________________________________________   LABS (all labs ordered are listed, but only abnormal results are displayed)  Labs Reviewed  WET PREP, GENITAL - Abnormal; Notable for the following components:      Result Value   WBC, Wet Prep HPF POC FEW (*)    All other components within normal limits  URINALYSIS, COMPLETE (UACMP) WITH MICROSCOPIC - Abnormal; Notable for the following components:   APPearance HAZY (*)    Bilirubin Urine SMALL (*)    Ketones, ur TRACE (*)    Protein, ur TRACE (*)    Leukocytes,Ua TRACE (*)    Bacteria, UA FEW (*)    All other components within normal limits  URINE CULTURE  PREGNANCY, URINE   Radiology  CLINICAL DATA:  Pelvic pain and left adnexal tenderness.  EXAM: TRANSABDOMINAL AND TRANSVAGINAL ULTRASOUND OF PELVIS  DOPPLER ULTRASOUND OF OVARIES  TECHNIQUE: Both transabdominal and transvaginal ultrasound examinations of the pelvis were performed. Transabdominal technique was performed for global imaging of the pelvis including uterus, ovaries, adnexal regions, and pelvic cul-de-sac.  It was necessary to proceed with endovaginal exam following the transabdominal exam to visualize the ovaries. Color and duplex Doppler ultrasound was utilized to evaluate blood flow to the ovaries.  COMPARISON:  None recent  FINDINGS: Uterus  Measurements: 8.6 x 4.2 x 5.2 cm = volume: 98 mL. No fibroids or other mass visualized.  Endometrium  Thickness: 0.8 cm.  No focal abnormality visualized.  Right ovary  Measurements: 2.8 x 1.6 x 2 cm = volume: 4.65 mL. Normal appearance/no adnexal mass.  Left ovary  Measurements: 3 x 2 x 1.7 cm = volume: 5.39 mL. Normal appearance/no adnexal mass.  Pulsed Doppler evaluation of both ovaries demonstrates normal low-resistance arterial and venous waveforms.  Other  findings  No abnormal free fluid.  IMPRESSION: Normal study.  No evidence for ovarian torsion.   Electronically Signed   By: Katherine Mantlehristopher  Green M.D.   On: 11/13/2018 17:26  CLINICAL DATA:  Pelvic pain and left adnexal tenderness.  EXAM: TRANSABDOMINAL AND TRANSVAGINAL ULTRASOUND OF PELVIS  DOPPLER ULTRASOUND OF OVARIES  TECHNIQUE: Both transabdominal and transvaginal ultrasound examinations of the pelvis were performed. Transabdominal technique was performed for global imaging of the pelvis including uterus, ovaries, adnexal regions, and pelvic cul-de-sac.  It was necessary to proceed with endovaginal exam following the transabdominal exam to visualize the ovaries. Color and duplex Doppler ultrasound was utilized to evaluate blood flow to the ovaries.  COMPARISON:  None recent  FINDINGS: Uterus  Measurements: 8.6 x 4.2 x 5.2 cm = volume: 98 mL. No fibroids or other mass visualized.  Endometrium  Thickness: 0.8 cm.  No focal abnormality visualized.  Right ovary  Measurements: 2.8 x 1.6 x 2 cm = volume: 4.65 mL. Normal appearance/no adnexal mass.  Left ovary  Measurements: 3 x 2 x 1.7 cm = volume: 5.39 mL. Normal appearance/no adnexal mass.  Pulsed Doppler evaluation of both ovaries demonstrates normal low-resistance arterial and venous waveforms.  Other findings  No abnormal free fluid.  IMPRESSION: Normal study.  No evidence for ovarian torsion.   Electronically Signed   By: Katherine Mantlehristopher  Green M.D.   On: 11/13/2018 17:26 ____________________________________________  PROCEDURES Procedures    INITIAL IMPRESSION / ASSESSMENT AND PLAN / ED COURSE  Pertinent labs & imaging results that were available during my care of the patient were reviewed by me and considered in my medical decision making (see chart for details).  Well-appearing patient.  No acute distress.  Reports some cloudy urine, possible UTI by urine.  We will  culture and empirically start oral Keflex.  Recommend for patient to establish local gynecology care and strict follow-up discussed.  We will also order pelvic ultrasound to further evaluate pelvic discomfort.  Follow-up with gynecology.  Rest, fluids, monitor, supportive care.  Proceed erectly to emergency room for worsening complaints.  Discussed follow up with Primary care physician. Discussed follow up and return parameters including no resolution or any worsening concerns. Patient verbalized understanding and agreed to plan.    Addendum 11/13/18: 1930: Pelvic ultrasound results reviewed from radiologist and called and discussed with patient.  Pelvic ultrasound results negative.  Patient verbalized understanding.  Reiterated importance of follow-up with gynecology and primary. ____________________________________________   FINAL CLINICAL IMPRESSION(S) / ED DIAGNOSES  Final diagnoses:  Dysuria  Pelvic pain     ED Discharge Orders  Ordered    US PELVIC COMPLETE WITH TRANSVAGINAL     11/13/18 1113    US PELVIC DOPPLER (TORSION R/O OR MASS ARTERIAL FLOW)     11/13/18 1113    cephALEXin (KEFLEX) 500 MG capsule  2 times daily     11/13/18 1124           Note: This dictation was prepared with Dragon dictation along with smaller phrase technology. Any transcriptional errors that result from this process are unintentional.         Renford DillsMiller, Yachet Mattson, NP 11/13/18 1931

## 2018-11-13 NOTE — Discharge Instructions (Addendum)
Take medication as prescribed. Rest. Drink plenty of fluids. Monitor closely. Ultrasound to be completed today at 3 pm (have a full bladder).   Follow up with health dept today as scheduled.   Follow up with gynecology as soon as possible. Return to urgent care as needed. Proceed directly to emergency room for worsening concerns.

## 2018-11-13 NOTE — ED Triage Notes (Signed)
Patient c/o pelvic pain and vaginal discharge that started a week ago.  Patient denies N/V.

## 2018-11-14 LAB — URINE CULTURE

## 2018-11-25 ENCOUNTER — Encounter: Payer: Self-pay | Admitting: Physician Assistant

## 2018-11-26 ENCOUNTER — Encounter: Payer: Self-pay | Admitting: Physician Assistant

## 2019-02-20 DIAGNOSIS — R42 Dizziness and giddiness: Secondary | ICD-10-CM

## 2019-02-21 ENCOUNTER — Ambulatory Visit (LOCAL_COMMUNITY_HEALTH_CENTER): Payer: Medicaid Other

## 2019-02-21 ENCOUNTER — Other Ambulatory Visit: Payer: Self-pay

## 2019-02-21 VITALS — BP 128/79 | Ht 67.0 in | Wt 174.0 lb

## 2019-02-21 DIAGNOSIS — Z3009 Encounter for other general counseling and advice on contraception: Secondary | ICD-10-CM

## 2019-02-21 DIAGNOSIS — Z30013 Encounter for initial prescription of injectable contraceptive: Secondary | ICD-10-CM | POA: Diagnosis not present

## 2019-02-21 MED ORDER — MEDROXYPROGESTERONE ACETATE 150 MG/ML IM SUSP
150.0000 mg | Freq: Once | INTRAMUSCULAR | Status: AC
  Administered 2019-02-21: 150 mg via INTRAMUSCULAR

## 2019-02-21 MED ORDER — MULTI-VITAMIN/MINERALS PO TABS: 1.0000 | ORAL_TABLET | Freq: Every day | ORAL | 0 refills | Status: DC

## 2019-02-21 NOTE — Progress Notes (Signed)
Presents for Depo 15 weeks and 2 days after last injection. Encouraged to schedule appt for Depo injections as ordered between 11 - 13 weeks. Depo administered today per 11/06/2018 written order of Antoine Primas PA and client tolerated without complaint. Client aware Depo was ordered for onl y 6 months and needs physical with net Depo. Rich Number, RN

## 2019-02-23 ENCOUNTER — Other Ambulatory Visit: Payer: Self-pay

## 2019-02-23 ENCOUNTER — Telehealth: Payer: Medicaid Other

## 2019-02-23 ENCOUNTER — Encounter: Payer: Self-pay | Admitting: Emergency Medicine

## 2019-02-23 ENCOUNTER — Emergency Department
Admission: EM | Admit: 2019-02-23 | Discharge: 2019-02-23 | Disposition: A | Discharge status: Home / Self Care | Payer: Medicaid Other | Attending: Emergency Medicine | Admitting: Emergency Medicine

## 2019-02-23 DIAGNOSIS — G43909 Migraine, unspecified, not intractable, without status migrainosus: Secondary | ICD-10-CM | POA: Insufficient documentation

## 2019-02-23 DIAGNOSIS — I10 Essential (primary) hypertension: Secondary | ICD-10-CM | POA: Diagnosis not present

## 2019-02-23 DIAGNOSIS — Z79899 Other long term (current) drug therapy: Secondary | ICD-10-CM | POA: Diagnosis not present

## 2019-02-23 DIAGNOSIS — R519 Headache, unspecified: Secondary | ICD-10-CM | POA: Diagnosis present

## 2019-02-23 HISTORY — DX: Essential (primary) hypertension: I10

## 2019-02-23 LAB — URINALYSIS, COMPLETE (UACMP) WITH MICROSCOPIC
Bacteria, UA: NONE SEEN
Bilirubin Urine: NEGATIVE
Glucose, UA: NEGATIVE mg/dL
Hgb urine dipstick: NEGATIVE
Ketones, ur: NEGATIVE mg/dL
Leukocytes,Ua: NEGATIVE
Nitrite: NEGATIVE
Protein, ur: NEGATIVE mg/dL
Specific Gravity, Urine: 1.018 (ref 1.005–1.030)
pH: 8 (ref 5.0–8.0)

## 2019-02-23 LAB — BASIC METABOLIC PANEL
Anion gap: 7 (ref 5–15)
BUN: 10 mg/dL (ref 6–20)
CO2: 24 mmol/L (ref 22–32)
Calcium: 9.6 mg/dL (ref 8.9–10.3)
Chloride: 106 mmol/L (ref 98–111)
Creatinine, Ser: 0.77 mg/dL (ref 0.44–1.00)
GFR calc Af Amer: >60 mL/min (ref >60)
GFR calc non Af Amer: >60 mL/min (ref >60)
Glucose, Bld: 79 mg/dL (ref 70–99)
Potassium: 4.2 mmol/L (ref 3.5–5.1)
Sodium: 137 mmol/L (ref 135–145)

## 2019-02-23 LAB — CBC
HCT: 40.8 % (ref 36.0–46.0)
Hemoglobin: 13.2 g/dL (ref 12.0–15.0)
MCH: 27 pg (ref 26.0–34.0)
MCHC: 32.4 g/dL (ref 30.0–36.0)
MCV: 83.4 fL (ref 80.0–100.0)
Platelets: 226 10*3/uL (ref 150–400)
RBC: 4.89 MIL/uL (ref 3.87–5.11)
RDW: 14.3 % (ref 11.5–15.5)
WBC: 9.2 10*3/uL (ref 4.0–10.5)
nRBC: 0 % (ref 0.0–0.2)

## 2019-02-23 LAB — POCT PREGNANCY, URINE: Preg Test, Ur: NEGATIVE

## 2019-02-23 MED ORDER — IBUPROFEN 600 MG PO TABS: 600.0000 mg | ORAL_TABLET | Freq: Three times a day (TID) | ORAL | 0 refills | Status: DC | PRN

## 2019-02-23 MED ORDER — ONDANSETRON 4 MG PO TBDP: 4.0000 mg | ORAL_TABLET | Freq: Three times a day (TID) | ORAL | 0 refills | Status: DC | PRN

## 2019-02-23 NOTE — ED Provider Notes (Addendum)
[facility] Emergency Department Provider Note  ____________________________________________   First MD Initiated Contact with Patient 02/23/19 1517     (approximate)  I have reviewed the triage vital signs and the nursing notes.   HISTORY  Chief Complaint Headache    HPI Dawn Skinner is a 28 y.o. female  Here with headache. Pt reports that off and on for the past month or so, but particularly today, she's had episodes in which she feels a sharp, often unilateral headache that then becomes aching and throbbing. She occasionally feels nauseous with this and ahs some transient blurred vision. She noticed these episodes increasing after birth of her son 16 months ago. No persistent HA. No focal numbness, weakness. Questionable h/o migraines in past. No fever, chills. No recent head trauma. No other complaints. Had an episode earlier today with worsening lightheadedness so presents now, though sx are now resolved.        Past Medical History:  Diagnosis Date  . Hypertension   . Iron deficiency anemia     Patient Active Problem List   Diagnosis Date Noted  . Hypertension 11/06/2018  . Vertigo 11/06/2018    Past Surgical History:  Procedure Laterality Date  . NO PAST SURGERIES      Prior to Admission medications   Medication Sig Start Date End Date Taking? Authorizing Provider  cetirizine (ZYRTEC) 10 MG tablet Take 1 tablet (10 mg total) by mouth daily. Patient not taking: Reported on 02/21/2019 10/30/18   Bailey Mech, NP  Doxylamine-Pyridoxine (DICLEGIS) 10-10 MG TBEC Take 2 tablets by mouth at bedtime. If symptoms persist, add one tablet in the morning and one in the afternoon Patient not taking: Reported on 02/21/2019 05/24/17   Oswaldo Conroy, CNM  famotidine (PEPCID) 20 MG tablet Take 1 tablet (20 mg total) by mouth 2 (two) times daily. Patient not taking: Reported on 02/21/2019 05/24/17   Oswaldo Conroy, CNM  fluticasone [facility]) 50  MCG/ACT nasal spray Place 1 spray into both nostrils daily. Patient not taking: Reported on 02/21/2019 10/30/18   Bailey Mech, NP  hydrochlorothiazide (HYDRODIURIL) 12.5 MG tablet Take 1 tablet (12.5 mg total) by mouth daily. Patient not taking: Reported on 02/21/2019 10/30/18   Bailey Mech, NP  ibuprofen (ADVIL) 600 MG tablet Take 1 tablet (600 mg total) by mouth every 8 (eight) hours as needed. 02/23/19   Shaune Pollack, MD  meclizine (ANTIVERT) 12.5 MG tablet Take 2 tablets (25 mg total) by mouth 3 (three) times daily as needed for dizziness. Patient not taking: Reported on 02/21/2019 10/30/18   Bailey Mech, NP  metoCLOPramide (REGLAN) 10 MG tablet Take 1 tablet (10 mg total) by mouth 3 (three) times daily before meals. Patient not taking: Reported on 02/21/2019 05/24/17   Oswaldo Conroy, CNM  Multiple Vitamins-Minerals (MULTIVITAMIN WITH MINERALS) tablet Take 1 tablet by mouth daily. 02/21/19   Federico Flake, MD  ondansetron (ZOFRAN ODT) 4 MG disintegrating tablet Take 1 tablet (4 mg total) by mouth every 8 (eight) hours as needed for nausea or vomiting. 02/23/19   Shaune Pollack, MD  ondansetron (ZOFRAN) 4 MG tablet Take 1 tablet (4 mg total) by mouth every 4 (four) hours as needed for nausea or vomiting. Patient not taking: Reported on 02/21/2019 05/24/17   Oswaldo Conroy, CNM  Prenatal Vit-Fe Fumarate-FA (PRENATAL ONE DAILY) 27-0.8 MG TABS Take 1 tablet by mouth daily. 10/03/16   Hassan Rowan, MD    Allergies Patient has no known allergies.  Family History  Problem Relation Age of Onset  . Hypertension Mother   . Hypertension Father     Social History Social History   Tobacco Use  . Smoking status: Never Smoker  . Smokeless tobacco: Never Used  Substance Use Topics  . Alcohol use: Yes    Comment: occ  . Drug use: No    Review of Systems  Review of Systems  Constitutional: Negative for fatigue and fever.  HENT: Negative for congestion and sore throat.    Eyes: Positive for visual disturbance.  Respiratory: Negative for cough and shortness of breath.   Cardiovascular: Negative for chest pain.  Gastrointestinal: Negative for abdominal pain, diarrhea, nausea and vomiting.  Genitourinary: Negative for flank pain.  Musculoskeletal: Negative for back pain and neck pain.  Skin: Negative for rash and wound.  Neurological: Positive for light-headedness and headaches. Negative for weakness.  All other systems reviewed and are negative.    ____________________________________________  PHYSICAL EXAM:      VITAL SIGNS: ED Triage Vitals  Enc Vitals Group     BP 02/23/19 1253 (!) 132/91     Pulse Rate 02/23/19 1253 88     Resp 02/23/19 1253 18     Temp 02/23/19 1253 98.4 F (36.9 C)     Temp Source 02/23/19 1253 Oral     SpO2 02/23/19 1253 97 %     Weight 02/23/19 1254 174 lb (78.9 kg)     Height 02/23/19 1254 5\' 7"  (1.702 m)     Head Circumference --      Peak Flow --      Pain Score 02/23/19 1253 6     Pain Loc --      Pain Edu? --      Excl. in [place]? --      Physical Exam Vitals signs and nursing note reviewed.  Constitutional:      General: She is not in acute distress.    Appearance: She is well-developed.  HENT:     Head: Normocephalic and atraumatic.  Eyes:     Conjunctiva/sclera: Conjunctivae normal.  Neck:     Musculoskeletal: Neck supple.  Cardiovascular:     Rate and Rhythm: Normal rate and regular rhythm.     Heart sounds: Normal heart sounds. No murmur. No friction rub.  Pulmonary:     Effort: Pulmonary effort is normal. No respiratory distress.     Breath sounds: Normal breath sounds. No wheezing or rales.  Abdominal:     General: There is no distension.     Palpations: Abdomen is soft.     Tenderness: There is no abdominal tenderness.  Skin:    General: Skin is warm.     Capillary Refill: Capillary refill takes less than 2 seconds.  Neurological:     Mental Status: She is alert and oriented to person,  place, and time.     Motor: No abnormal muscle tone.      Neurological Exam:  Mental Status: Alert and oriented to person, place, and time. Attention and concentration normal. Speech clear. Recent memory is intact. Cranial Nerves: Visual fields grossly intact. EOMI and PERRLA. No nystagmus noted. Facial sensation intact at forehead, maxillary cheek, and chin/mandible bilaterally. No facial asymmetry or weakness. Hearing grossly normal. Uvula is midline, and palate elevates symmetrically. Normal SCM and trapezius strength. Tongue midline without fasciculations. Motor: Muscle strength 5/5 in proximal and distal UE and LE bilaterally. No pronator drift. Muscle tone normal. Reflexes: 2+ and symmetrical in all  four extremities.  Sensation: Intact to light touch in upper and lower extremities distally bilaterally.  Gait: Normal without ataxia. Coordination: Normal FTN bilaterally.     ____________________________________________   LABS (all labs ordered are listed, but only abnormal results are displayed)  Labs Reviewed  URINALYSIS, COMPLETE (UACMP) WITH MICROSCOPIC - Abnormal; Notable for the following components:      Result Value   Color, Urine YELLOW (*)    APPearance CLEAR (*)    All other components within normal limits  BASIC METABOLIC PANEL  CBC  POC URINE PREG, ED  POCT PREGNANCY, URINE    ____________________________________________  EKG: Normal sinus rhythm, VR 100. PR 114, QRS 134, QTc 485. RBBB, no acute ischemia or infarct.   EKG Interpretation  Date/Time:  "Sunday February 23 2019 13:16:18 EDT Ventricular Rate:  100 PR Interval:  114 QRS Duration: 134 QT Interval:  376 QTC Calculation: 485 R Axis:   78 Text Interpretation:  Normal sinus rhythm Right bundle branch block Abnormal ECG When compared with ECG of 23-Feb-2019 12:58, (unconfirmed) Right bundle branch block is now Present Criteria for Septal infarct are no longer Present Confirmed by UNCONFIRMED, DOCTOR  ([id number]), editor Bailey, Tammy ([id number]) on 02/24/2019 12:49:45 PM       ________________________________________  RADIOLOGY All imaging, including plain films, CT scans, and ultrasounds, independently reviewed by me, and interpretations confirmed via formal radiology reads.  ED MD interpretation:   None  Official radiology report(s): No results found.  ____________________________________________  PROCEDURES   Procedure(s) performed (including Critical Care):  Procedures  ____________________________________________  INITIAL IMPRESSION / MDM / ASSESSMENT AND PLAN / ED COURSE  As part of my medical decision making, I reviewed the following data within the electronic MEDICAL RECORD NUMBER Notes from prior ED visits and [place] Controlled Substance Database      *Dawn Skinner was evaluated in Emergency Department on 02/23/2019 for the symptoms described in the history of present illness. She was evaluated in the context of the global COVID-19 pandemic, which necessitated consideration that the patient might be at risk for infection with the SARS-CoV-2 virus that causes COVID-19. Institutional protocols and algorithms that pertain to the evaluation of patients at risk for COVID-19 are in a state of rapid change based on information released by regulatory bodies including the CDC and federal and state organizations. These policies and algorithms were followed during the patient's care in the ED.  Some ED evaluations and interventions may be delayed as a result of limited staffing during the pandemic.*      Medical Decision Making:  28 yo F here with moderate headache, now resolved. Questionable h/o migraines. Sx worsening in setting of poor sleep and increased stress 2/2 child at home. Suspect primary HA, likely migraine. Her vision changes are subjective only and transient, with no signs of glaucoma, RD, or ocular abnormality on exam. No focal sx to suggest CVA, mass/lesion. No fever,  chills, or s/s meningitis or encephalitis. Will treat symptomatically, refer for outpt follow-up.  ____________________________________________  FINAL CLINICAL IMPRESSION(S) / ED DIAGNOSES  Final diagnoses:  Migraine without status migrainosus, not intractable, unspecified migraine type     MEDICATIONS GIVEN DURING THIS VISIT:  Medications - No data to display   ED Discharge Orders         Ordered    ibuprofen (ADVIL) 600 MG tablet  Every 8 hours PRN     10" /11/20 1545    ondansetron (ZOFRAN ODT) 4 MG disintegrating tablet  Every 8 hours PRN  02/23/19 1545           Note:  This document was prepared using Dragon voice recognition software and may include unintentional dictation errors.   Shaune Pollack, MD 02/23/19 2031    Shaune Pollack, MD 03/12/19 2049

## 2019-02-23 NOTE — ED Notes (Signed)
E-signature not working at this time. Pt verbalized understanding of D/C instructions and follow up care. No further questions at this time. Pt in NAD at time of D/C.

## 2019-02-23 NOTE — ED Triage Notes (Signed)
Pt arrived via POV with reports of headache that comes and goes for several weeks and blurred vision for the past week.  Pt states sometimes when she is at home watching her kids she feels like she is zoned out. States today when she was getting ready for work her vision got worse.   Pt reports feeling dizzy and lightheaded.

## 2019-06-09 ENCOUNTER — Ambulatory Visit (LOCAL_COMMUNITY_HEALTH_CENTER): Payer: Medicaid Other | Admitting: Family Medicine

## 2019-06-09 ENCOUNTER — Other Ambulatory Visit: Payer: Self-pay

## 2019-06-09 ENCOUNTER — Encounter: Payer: Self-pay | Admitting: Family Medicine

## 2019-06-09 VITALS — BP 129/86 | Ht 66.0 in | Wt 198.2 lb

## 2019-06-09 DIAGNOSIS — Z30013 Encounter for initial prescription of injectable contraceptive: Secondary | ICD-10-CM

## 2019-06-09 DIAGNOSIS — Z3042 Encounter for surveillance of injectable contraceptive: Secondary | ICD-10-CM

## 2019-06-09 DIAGNOSIS — Z3009 Encounter for other general counseling and advice on contraception: Secondary | ICD-10-CM | POA: Diagnosis not present

## 2019-06-09 DIAGNOSIS — Z113 Encounter for screening for infections with a predominantly sexual mode of transmission: Secondary | ICD-10-CM

## 2019-06-09 LAB — PREGNANCY, URINE: Preg Test, Ur: NEGATIVE

## 2019-06-09 LAB — WET PREP FOR TRICH, YEAST, CLUE
Trichomonas Exam: NEGATIVE
Yeast Exam: NEGATIVE

## 2019-06-09 MED ORDER — MEDROXYPROGESTERONE ACETATE 150 MG/ML IM SUSP
150.0000 mg | INTRAMUSCULAR | Status: DC
  Administered 2019-06-09 – 2019-11-25 (×3): 150 mg via INTRAMUSCULAR

## 2019-06-09 NOTE — Progress Notes (Signed)
Here today for Depo and states she needs a Pap. Patient has never had PE at [facility]. States she moved from [place] last year. States she was told at last Depo appointment that she would need a Pap at this visit. Last Depo 02/21/2019. Patient is 15 3/7 since last Depo.Burt Knack, RN

## 2019-06-09 NOTE — Progress Notes (Signed)
Depo given today, tolerated well, next Depo due card given. Wet mount reviewed, no treatment indicated. Pregnancy test negative.Burt Knack, RN

## 2019-06-09 NOTE — Progress Notes (Signed)
Family Planning Visit- Initial Visit  Subjective:  Dawn Skinner is a 29 y.o. being seen today for an initial well woman visit and to discuss family planning options.  She is currently using Depo-Provera injections for pregnancy prevention. Patient reports she does not want a pregnancy in the next year.  Patient has the following medical conditions has Hypertension and Vertigo on their problem list.  Chief Complaint  Patient presents with  . Contraception    Depo    Patient reports she would like depo and is due for a pap. Has been on Depo ~6 months. She is [redacted]w[redacted]d s/p last injection in [place]. Last sex was 1 month ago.   Pt denies all of the following, which are contraindications to Depo use: Known breast cancer Pregnancy Also denies: Severe cirrhosis, hepatocellular adenoma Diabetes with nephrosis or vascular complications Ischemic heart disease or multiple risk factors for atherosclerotic disease, and some forms of lupus Unexplained vaginal bleeding Pregnancy planned within the next year Long-term use of corticosteroid therapy in women with a history of, or risk factors for, nontraumatic (frailty) fractures.  Current use of aminoglutethimide (usually for the treatment of Cushing's syndrome) because aminoglutethimide may increase metabolism of progestins  Reports last pap was normal, unsure of exact date. No hx abn paps.  Body mass index is 31.99 kg/m. - Patient is eligible for diabetes screening based on BMI and age >25?  no HA1C ordered? not applicable  Patient reports 1 of partners in last year. Desires STI screening?  Yes  Does the patient have a current or past history of drug use? No   No components found for: HCV]   Health Maintenance Due  Topic Date Due  . PAP-Cervical Cytology Screening  08/21/2011  . PAP SMEAR-Modifier  08/21/2011  . INFLUENZA VACCINE  12/14/2018    ROS  The following portions of the patient's history were reviewed and updated as  appropriate: allergies, current medications, past family history, past medical history, past social history, past surgical history and problem list. Problem list updated.   See flowsheet for other program required questions.  Objective:   Vitals:   06/09/19 1048  BP: 129/86  Weight: 198 lb 3.2 oz (89.9 kg)  Height: 5\' 6"  (1.676 m)    Physical Exam Vitals and nursing note reviewed.  Constitutional:      Appearance: Normal appearance.  HENT:     Head: Normocephalic and atraumatic.     Mouth/Throat:     Mouth: Mucous membranes are moist.     Pharynx: Oropharynx is clear. No oropharyngeal exudate or posterior oropharyngeal erythema.  Eyes:     Conjunctiva/sclera: Conjunctivae normal.  Neck:     Thyroid: No thyroid mass, thyromegaly or thyroid tenderness.  Cardiovascular:     Rate and Rhythm: Normal rate and regular rhythm.     Pulses: Normal pulses.     Heart sounds: Normal heart sounds.  Pulmonary:     Effort: Pulmonary effort is normal.     Breath sounds: Normal breath sounds.  Chest:     Breasts:        Right: Normal. No swelling, mass, nipple discharge, skin change or tenderness.        Left: Normal. No swelling, mass, nipple discharge, skin change or tenderness.  Abdominal:     General: Abdomen is flat.     Palpations: There is no mass.     Tenderness: There is no abdominal tenderness. There is no rebound.  Genitourinary:     General:  Normal vulva.     Exam position: Lithotomy position.     Pubic Area: No rash or pubic lice.      Labia:        Right: No rash or lesion.        Left: No rash or lesion.      Vagina: Normal. No vaginal erythema, bleeding or lesions. +scant white discharge, ph <4.5    Cervix: No cervical motion tenderness, discharge, friability, lesion or erythema.     Uterus: Normal.      Adnexa: Right adnexa normal and left adnexa normal.     Rectum: Normal.  Lymphadenopathy:     Head:     Right side of head: No preauricular or posterior  auricular adenopathy.     Left side of head: No preauricular or posterior auricular adenopathy.     Cervical: No cervical adenopathy.     Upper Body:     Right upper body: No supraclavicular or axillary adenopathy.     Left upper body: No supraclavicular or axillary adenopathy.     Lower Body: No right inguinal adenopathy. No left inguinal adenopathy.  Skin:    General: Skin is warm and dry.     Findings: No rash.  Neurological:     Mental Status: She is alert and oriented to person, place, and time.      Assessment and Plan:  Dawn Skinner is a 29 y.o. female [facility] for an initial well woman exam/family planning visit  Contraception counseling: Reviewed all forms of birth control options in the tiered based approach. available including abstinence; over the counter/barrier methods; hormonal contraceptive medication including pill, patch, ring, injection,contraceptive implant; hormonal and nonhormonal IUDs; permanent sterilization options including vasectomy and the various tubal sterilization modalities. Risks, benefits, and typical effectiveness rates were reviewed.  Questions were answered.  Written information was also given to the patient to review.  Patient desires depo, this was prescribed for patient. She will follow up in  3 months for surveillance.  She was told to call with any further questions, or with any concerns about this method of contraception.  Emphasized use of condoms 100% of the time for STI prevention.  ECP: n/a - last sex 1 month ago.   1. Screening examination for venereal disease -Screenings today as below. Treat wet prep per standing order. -Patient does not meet criteria for HepB, HepC Screening.  -Counseled on warning s/sx and when to seek care. Recommended condom use with all sex and discussed importance of condom use for STI prevention. - WET PREP FOR TRICH, YEAST, CLUE - HIV [facility] - Syphilis Serology,  [facility] - Chlamydia/Gonorrhea  Lab  2. Encounter for surveillance of injectable contraceptive -Rx depo x1 yr. Counseling as above.  -She is [redacted]w[redacted]d post last depo. Urine preg test today and advised backup method x7 days.  - Pregnancy, urine - medroxyPROGESTERone (DEPO-PROVERA) injection 150 mg  3. Family planning services -No record of prior pap per RN. Pt believes next on is due now, states no hx abnormal paps. Pap obtained today.  - IGP, rfx Apt[facility] HPV ASCU    Return in about 3 months (around 09/07/2019) for Depo.  No future appointments.  Ann Held, PA-C

## 2019-06-10 LAB — IGP, RFX APTIMA HPV ASCU: PAP Smear Comment: 0

## 2019-08-19 ENCOUNTER — Ambulatory Visit: Payer: Medicaid Other

## 2019-08-28 ENCOUNTER — Ambulatory Visit: Payer: Medicaid Other

## 2019-09-01 ENCOUNTER — Ambulatory Visit (LOCAL_COMMUNITY_HEALTH_CENTER): Payer: Medicaid Other

## 2019-09-01 ENCOUNTER — Other Ambulatory Visit: Payer: Self-pay

## 2019-09-01 VITALS — BP 128/83 | Wt 213.0 lb

## 2019-09-01 DIAGNOSIS — Z3009 Encounter for other general counseling and advice on contraception: Secondary | ICD-10-CM | POA: Diagnosis not present

## 2019-09-01 DIAGNOSIS — Z30013 Encounter for initial prescription of injectable contraceptive: Secondary | ICD-10-CM

## 2019-09-01 DIAGNOSIS — Z3042 Encounter for surveillance of injectable contraceptive: Secondary | ICD-10-CM

## 2019-11-25 ENCOUNTER — Other Ambulatory Visit: Payer: Self-pay

## 2019-11-25 ENCOUNTER — Ambulatory Visit (LOCAL_COMMUNITY_HEALTH_CENTER): Payer: Medicaid Other

## 2019-11-25 VITALS — BP 135/98 | Ht 66.0 in | Wt 208.5 lb

## 2019-11-25 DIAGNOSIS — Z30013 Encounter for initial prescription of injectable contraceptive: Secondary | ICD-10-CM

## 2019-11-25 DIAGNOSIS — Z3009 Encounter for other general counseling and advice on contraception: Secondary | ICD-10-CM | POA: Diagnosis not present

## 2019-11-25 MED ORDER — MULTI-VITAMIN/MINERALS PO TABS: 1.0000 | ORAL_TABLET | Freq: Every day | ORAL | 0 refills | Status: DC

## 2019-11-25 NOTE — Progress Notes (Signed)
Repeat BP after 5 minutes of sitting (and no talking of client during reading) was 136/86. Client encouraged to schedule BP evaluation with PCP (has [employer] Medicaid). Adult Health Services information sheet given to client. Depo administered without difficulty in right deltoid per 06/09/19 written order of J. Staples PA-C. Client tolerated injection without complaint. Jossie Ng, RN

## 2019-12-26 ENCOUNTER — Ambulatory Visit: Payer: Self-pay

## 2020-01-01 ENCOUNTER — Emergency Department: Payer: Medicaid Other

## 2020-01-01 ENCOUNTER — Other Ambulatory Visit: Payer: Self-pay

## 2020-01-01 ENCOUNTER — Emergency Department
Admission: EM | Admit: 2020-01-01 | Discharge: 2020-01-01 | Disposition: A | Discharge status: Home / Self Care | Payer: Medicaid Other | Attending: Emergency Medicine | Admitting: Emergency Medicine

## 2020-01-01 DIAGNOSIS — R079 Chest pain, unspecified: Secondary | ICD-10-CM | POA: Diagnosis not present

## 2020-01-01 DIAGNOSIS — Z5321 Procedure and treatment not carried out due to patient leaving prior to being seen by health care provider: Secondary | ICD-10-CM | POA: Diagnosis not present

## 2020-01-01 DIAGNOSIS — R0602 Shortness of breath: Secondary | ICD-10-CM | POA: Diagnosis present

## 2020-01-01 LAB — CBC WITH DIFFERENTIAL/PLATELET
Abs Immature Granulocytes: 0.02 10*3/uL (ref 0.00–0.07)
Basophils Absolute: 0.1 10*3/uL (ref 0.0–0.1)
Basophils Relative: 1 %
Eosinophils Absolute: 0.3 10*3/uL (ref 0.0–0.5)
Eosinophils Relative: 2 %
HCT: 43.2 % (ref 36.0–46.0)
Hemoglobin: 14.2 g/dL (ref 12.0–15.0)
Immature Granulocytes: 0 %
Lymphocytes Relative: 42 %
Lymphs Abs: 5 10*3/uL — ABNORMAL HIGH (ref 0.7–4.0)
MCH: 27.5 pg (ref 26.0–34.0)
MCHC: 32.9 g/dL (ref 30.0–36.0)
MCV: 83.6 fL (ref 80.0–100.0)
Monocytes Absolute: 0.8 10*3/uL (ref 0.1–1.0)
Monocytes Relative: 7 %
Neutro Abs: 5.7 10*3/uL (ref 1.7–7.7)
Neutrophils Relative %: 48 %
Platelets: 218 10*3/uL (ref 150–400)
RBC: 5.17 MIL/uL — ABNORMAL HIGH (ref 3.87–5.11)
RDW: 14.2 % (ref 11.5–15.5)
WBC: 11.9 10*3/uL — ABNORMAL HIGH (ref 4.0–10.5)
nRBC: 0 % (ref 0.0–0.2)

## 2020-01-01 LAB — COMPREHENSIVE METABOLIC PANEL
ALT: 20 U/L (ref 0–44)
AST: 23 U/L (ref 15–41)
Albumin: 4.7 g/dL (ref 3.5–5.0)
Alkaline Phosphatase: 65 U/L (ref 38–126)
Anion gap: 13 (ref 5–15)
BUN: 13 mg/dL (ref 6–20)
CO2: 20 mmol/L — ABNORMAL LOW (ref 22–32)
Calcium: 9.5 mg/dL (ref 8.9–10.3)
Chloride: 105 mmol/L (ref 98–111)
Creatinine, Ser: 1.06 mg/dL — ABNORMAL HIGH (ref 0.44–1.00)
GFR calc Af Amer: >60 mL/min (ref >60)
GFR calc non Af Amer: >60 mL/min (ref >60)
Glucose, Bld: 111 mg/dL — ABNORMAL HIGH (ref 70–99)
Potassium: 3.6 mmol/L (ref 3.5–5.1)
Sodium: 138 mmol/L (ref 135–145)
Total Bilirubin: 0.8 mg/dL (ref 0.3–1.2)
Total Protein: 8.2 g/dL — ABNORMAL HIGH (ref 6.5–8.1)

## 2020-01-01 LAB — TROPONIN I (HIGH SENSITIVITY)
Troponin I (High Sensitivity): <2 ng/L (ref <18)
Troponin I (High Sensitivity): <2 ng/L (ref <18)

## 2020-01-01 LAB — POCT PREGNANCY, URINE: Preg Test, Ur: NEGATIVE

## 2020-01-01 NOTE — ED Triage Notes (Signed)
See paper chart for downtime 

## 2020-01-05 ENCOUNTER — Telehealth: Payer: Self-pay | Admitting: *Deleted

## 2020-01-05 NOTE — Telephone Encounter (Signed)
Dawn Skinner presented to the ED and left before being seen by the provider on 01/01/20. The patient has been enrolled in an automated general discharge outreach program and 2 attempts to contact the patient will be made to follow up on their ED visit and subsequent needs. The care management team is available to provide assistance to this patient at any time.   Burnard Bunting, RN, BSN, CCRN Patient Engagement Center [phone]

## 2020-01-07 ENCOUNTER — Other Ambulatory Visit: Payer: Self-pay

## 2020-01-07 ENCOUNTER — Ambulatory Visit: Payer: Self-pay | Admitting: *Deleted

## 2020-01-07 ENCOUNTER — Encounter: Payer: Self-pay | Admitting: Emergency Medicine

## 2020-01-07 ENCOUNTER — Emergency Department: Payer: Medicaid Other

## 2020-01-07 ENCOUNTER — Emergency Department
Admission: EM | Admit: 2020-01-07 | Discharge: 2020-01-07 | Disposition: A | Discharge status: Home / Self Care | Payer: Medicaid Other | Attending: Emergency Medicine | Admitting: Emergency Medicine

## 2020-01-07 DIAGNOSIS — Z5321 Procedure and treatment not carried out due to patient leaving prior to being seen by health care provider: Secondary | ICD-10-CM | POA: Insufficient documentation

## 2020-01-07 DIAGNOSIS — R519 Headache, unspecified: Secondary | ICD-10-CM | POA: Diagnosis not present

## 2020-01-07 DIAGNOSIS — H538 Other visual disturbances: Secondary | ICD-10-CM | POA: Insufficient documentation

## 2020-01-07 DIAGNOSIS — R202 Paresthesia of skin: Secondary | ICD-10-CM | POA: Insufficient documentation

## 2020-01-07 LAB — CBC
HCT: 41.9 % (ref 36.0–46.0)
Hemoglobin: 14 g/dL (ref 12.0–15.0)
MCH: 27.7 pg (ref 26.0–34.0)
MCHC: 33.4 g/dL (ref 30.0–36.0)
MCV: 82.8 fL (ref 80.0–100.0)
Platelets: 206 K/uL (ref 150–400)
RBC: 5.06 MIL/uL (ref 3.87–5.11)
RDW: 13.8 % (ref 11.5–15.5)
WBC: 9.2 K/uL (ref 4.0–10.5)
nRBC: 0 % (ref 0.0–0.2)

## 2020-01-07 LAB — DIFFERENTIAL
Abs Immature Granulocytes: 0.02 10*3/uL (ref 0.00–0.07)
Basophils Absolute: 0.1 10*3/uL (ref 0.0–0.1)
Basophils Relative: 1 %
Eosinophils Absolute: 0.1 10*3/uL (ref 0.0–0.5)
Eosinophils Relative: 2 %
Immature Granulocytes: 0 %
Lymphocytes Relative: 32 %
Lymphs Abs: 2.9 10*3/uL (ref 0.7–4.0)
Monocytes Absolute: 0.6 10*3/uL (ref 0.1–1.0)
Monocytes Relative: 7 %
Neutro Abs: 5.5 10*3/uL (ref 1.7–7.7)
Neutrophils Relative %: 58 %

## 2020-01-07 LAB — COMPREHENSIVE METABOLIC PANEL WITH GFR
ALT: 22 U/L (ref 0–44)
AST: 26 U/L (ref 15–41)
Albumin: 4.7 g/dL (ref 3.5–5.0)
Alkaline Phosphatase: 55 U/L (ref 38–126)
Anion gap: 11 (ref 5–15)
BUN: 8 mg/dL (ref 6–20)
CO2: 23 mmol/L (ref 22–32)
Calcium: 9.6 mg/dL (ref 8.9–10.3)
Chloride: 106 mmol/L (ref 98–111)
Creatinine, Ser: 1 mg/dL (ref 0.44–1.00)
GFR calc Af Amer: >60 mL/min (ref >60)
GFR calc non Af Amer: >60 mL/min (ref >60)
Glucose, Bld: 127 mg/dL — ABNORMAL HIGH (ref 70–99)
Potassium: 3.3 mmol/L — ABNORMAL LOW (ref 3.5–5.1)
Sodium: 140 mmol/L (ref 135–145)
Total Bilirubin: 1.1 mg/dL (ref 0.3–1.2)
Total Protein: 8.2 g/dL — ABNORMAL HIGH (ref 6.5–8.1)

## 2020-01-07 NOTE — ED Notes (Signed)
Patient states she is leaving because she has to go pick up her kids.

## 2020-01-07 NOTE — ED Triage Notes (Signed)
Pt presents via POV with c/o sudden onset of headache and blurred vision at 1230. Pt also c/o bilateral leg numbness. Pt currently alert and oriented x4.

## 2020-01-07 NOTE — Telephone Encounter (Signed)
Patient calls with lightheadedness, blurred vision and tingling in her legs. Attempting to make a new patient appointment at [facility].She does not have access to someone who can check her pressure at this time. With these symptoms ED is advised. She has been having similar symptoms for awhile, unsure how long. She sought treatment for same type symptoms at [facility] on 01/01/20 but left without treatment due to waiting all day. Patient explains she cannot wait at the ED all day or night because she has 3 kids to take care of. Advised her to start drinking water now and continue to drink over the next several hours. Explained high blood pressure can harm several organs and it is important to stay for treatment to prevent a possible stroke due to high blood pressure.  She will have her father drive her now to the UC or ED. Told patient someone would follow up with her later today or tomorrow regarding a new patient appointment at [facility].  Reason for Disposition . SEVERE dizziness (e.g., unable to stand, requires support to walk, feels like passing out now)  Answer Assessment - Initial Assessment Questions 1. DESCRIPTION: "Describe your dizziness."     Dizzy unstable 2. LIGHTHEADED: "Do you feel lightheaded?" (e.g., somewhat faint, woozy, weak upon standing)     Yes lightheaded while sitting also 3. VERTIGO: "Do you feel like either you or the room is spinning or tilting?" (i.e. vertigo)     no 4. SEVERITY: "How bad is it?"  "Do you feel like you are going to faint?" "Can you stand and walk?"   - MILD: Feels slightly dizzy, but walking normally.   - MODERATE: Feels very unsteady when walking, but not falling; interferes with normal activities (e.g., school, work) .   - SEVERE: Unable to walk without falling, or requires assistance to walk without falling; feels like passing out now.      Unsteady-does not feel well 5. ONSET:  "When did the dizziness begin?"     Last week-went to the ED and has been occurring  since 6. AGGRAVATING FACTORS: "Does anything make it worse?" (e.g., standing, change in head position)     no 7. HEART RATE: "Can you tell me your heart rate?" "How many beats in 15 seconds?"  (Note: not all patients can do this)       Feels though it is beating fast 8. CAUSE: "What do you think is causing the dizziness?"     Very high B/P when I went to the ED last week and did not receive treatment after waiting for many hours.  9. RECURRENT SYMPTOM: "Have you had dizziness before?" If Yes, ask: "When was the last time?" "What happened that time?"     Yes, with high blood pressure in the past but the medicine was discontinued 10. OTHER SYMPTOMS: "Do you have any other symptoms?" (e.g., fever, chest pain, vomiting, diarrhea, bleeding)       Blurred vision, tingling in the legs 11. PREGNANCY: "Is there any chance you are pregnant?" "When was your last menstrual period?"       Did not ask  Protocols used: DIZZINESS [facility]

## 2020-01-08 ENCOUNTER — Telehealth: Payer: Self-pay | Admitting: *Deleted

## 2020-01-08 NOTE — Telephone Encounter (Signed)
Triaged patient on 01/07/20 and advised ED. Patient was very upset and crying. Reaching out to patient today offering to schedule a new patient appointment at [facility]. Left VM for her to return call.

## 2020-01-12 ENCOUNTER — Telehealth: Payer: Self-pay | Admitting: *Deleted

## 2020-01-12 NOTE — Telephone Encounter (Signed)
Dawn Skinner presented to the ED and left before being seen by the provider on 01/07/20. The patient has been enrolled in an automated general discharge outreach program and 2 attempts to contact the patient will be made to follow up on their ED visit and subsequent needs. The care management team is available to provide assistance to this patient at any time.   Burnard Bunting, RN, BSN, CCRN Patient Engagement Center [phone]

## 2020-01-16 ENCOUNTER — Telehealth: Payer: Self-pay

## 2020-01-16 ENCOUNTER — Ambulatory Visit: Payer: Self-pay | Admitting: *Deleted

## 2020-01-16 NOTE — Telephone Encounter (Signed)
Copied from CRM [id number]. Topic: Appointment Scheduling - New Patient >> Jan 16, 2020  9:25 AM Elliot Gault wrote: Patient was seen in the ED on 01/01/2020 and 01/07/2020 for high BP .patient scheduled a NPA with Danielle Rankin for 03/09/2020, patient would like to be seen sooner, please advise

## 2020-01-16 NOTE — Telephone Encounter (Signed)
We should be able to see her sooner, I am ok with putting her on as a 3rd new patient for the day within the next 2 weeks.  Thanks

## 2020-01-16 NOTE — Telephone Encounter (Signed)
Summary: Clinical Advice   Patient wanted to speak with a nurse regarding her most recent ED visit on 01/07/2020 regarding high BP.   Patient scheduled NPA with Danielle Rankin at [facility] for 03/09/2020 sent a CRM to the practice requesting if patient can been seen sooner.       Reason for Disposition . Systolic BP  >= 160 OR Diastolic >= 100  Answer Assessment - Initial Assessment Questions 1. BLOOD PRESSURE: "What is the blood pressure?" "Did you take at least two measurements 5 minutes apart?"     134/97 8/27at ED, 164/80- Tuesday 2. ONSET: "When did you take your blood pressure?"     At home  3. HOW: "How did you obtain the blood pressure?" (e.g., visiting nurse, automatic home BP monitor)     Automatic machine- arm 4. HISTORY: "Do you have a history of high blood pressure?"     Last summer diagnosed and given medication- then after 6 months she stopped due to regulation 5. MEDICATIONS: "Are you taking any medications for blood pressure?" "Have you missed any doses recently?"     No medication now 6. OTHER SYMPTOMS: "Do you have any symptoms?" (e.g., headache, chest pain, blurred vision, difficulty breathing, weakness)     Can have weakness, blurred vision, chest pain 7. PREGNANCY: "Is there any chance you are pregnant?" "When was your last menstrual period?"     No- Depo Provera  Protocols used: BLOOD PRESSURE - HIGH-A-AH

## 2020-01-20 ENCOUNTER — Ambulatory Visit: Payer: Medicaid Other | Admitting: Family Medicine

## 2020-01-20 NOTE — Telephone Encounter (Signed)
appointment is scheduled for today around 3:00 pm.

## 2020-01-20 NOTE — Telephone Encounter (Signed)
Copied from CRM #[id number]. Topic: Appointment Scheduling - New Patient >> Jan 16, 2020  9:25 AM Bell, Tiffany M wrote: Patient was seen in the ED on 01/01/2020 and 01/07/2020 for high BP .patient scheduled a NPA with Nicole, Malfi for 03/09/2020, patient would like to be seen sooner, please advise 

## 2020-01-27 ENCOUNTER — Ambulatory Visit (INDEPENDENT_AMBULATORY_CARE_PROVIDER_SITE_OTHER): Payer: Medicaid Other | Admitting: Family Medicine

## 2020-01-27 ENCOUNTER — Other Ambulatory Visit: Payer: Self-pay

## 2020-01-27 ENCOUNTER — Encounter: Payer: Self-pay | Admitting: Family Medicine

## 2020-01-27 VITALS — BP 133/75 | HR 84 | Temp 98.5°F | Resp 17 | Ht 65.5 in | Wt 204.4 lb

## 2020-01-27 DIAGNOSIS — Z7689 Persons encountering health services in other specified circumstances: Secondary | ICD-10-CM | POA: Diagnosis not present

## 2020-01-27 DIAGNOSIS — R739 Hyperglycemia, unspecified: Secondary | ICD-10-CM | POA: Diagnosis not present

## 2020-01-27 DIAGNOSIS — Z79899 Other long term (current) drug therapy: Secondary | ICD-10-CM | POA: Diagnosis not present

## 2020-01-27 DIAGNOSIS — R635 Abnormal weight gain: Secondary | ICD-10-CM | POA: Diagnosis not present

## 2020-01-27 DIAGNOSIS — I10 Essential (primary) hypertension: Secondary | ICD-10-CM | POA: Diagnosis not present

## 2020-01-27 DIAGNOSIS — B86 Scabies: Secondary | ICD-10-CM | POA: Diagnosis not present

## 2020-01-27 LAB — POCT GLYCOSYLATED HEMOGLOBIN (HGB A1C): Hemoglobin A1C: 5.2 % (ref 4.0–5.6)

## 2020-01-27 MED ORDER — BLOOD PRESSURE KIT: PACK | 0 refills | Status: DC

## 2020-01-27 MED ORDER — PERMETHRIN 5 % EX CREA: 1.0000 "application " | TOPICAL_CREAM | Freq: Once | CUTANEOUS | 1 refills | Status: AC

## 2020-01-27 NOTE — Progress Notes (Signed)
Subjective:    Patient ID: Dawn Skinner, female    DOB: 02-13-1991, 29 y.o.   MRN: [id number]  Dawn Skinner is a 29 y.o. female presenting on 01/27/2020 for Establish Care and Rash (Rash toes, hands and now on the neck. Pt state it started off on her feet between her toes. She notice that it spreads when she scratchy it and a clear liquids drains from the rash.  )   HPI  Previous PCP was at [facility].  Records will not be requested as are available in Epic.  Past medical, family, and surgical history reviewed w/ pt.  Dawn Skinner has acute concerns for elevated blood pressure over the past few months, is currently with a normal BP in clinic today and is willing to monitor her BP over the next 2 weeks with a BP log for evaluation.  Has concerns for itching around her neck, in her finger webs and between her toes with linear excoriations.  Has tried topical hydrocortisone cream without relief of symptoms.  Has been present for a few weeks.   Depression screen PHQ 2/9 01/27/2020  Decreased Interest 0  Down, Depressed, Hopeless 0  PHQ - 2 Score 0    Social History   Tobacco Use  . Smoking status: Never Smoker  . Smokeless tobacco: Never Used  Vaping Use  . Vaping Use: Never used  Substance Use Topics  . Alcohol use: Yes    Alcohol/week: 3.0 standard drinks    Types: 3 Glasses of wine per week    Comment: 2-3 weekly   . Drug use: No    Review of Systems  Constitutional: Negative.   HENT: Negative.   Eyes: Negative.   Respiratory: Negative.   Cardiovascular: Negative.   Gastrointestinal: Negative.   Endocrine: Negative.   Genitourinary: Negative.   Musculoskeletal: Negative.   Skin: Positive for rash.  Allergic/Immunologic: Negative.   Neurological: Negative.   Hematological: Negative.   Psychiatric/Behavioral: Negative.    Per HPI unless specifically indicated above     Objective:    BP 133/75 (BP Location: Left Arm, Patient Position:  Sitting, Cuff Size: Normal)   Pulse 84   Temp 98.5 F (36.9 C) (Oral)   Resp 17   Ht 5' 5.5" (1.664 m)   Wt 204 lb 6.4 oz (92.7 kg)   SpO2 100%   BMI 33.50 kg/m   Wt Readings from Last 3 Encounters:  01/27/20 204 lb 6.4 oz (92.7 kg)  01/07/20 208 lb 8.9 oz (94.6 kg)  11/25/19 208 lb 8 oz (94.6 kg)    Physical Exam Vitals reviewed.  Constitutional:      General: She is not in acute distress.    Appearance: Normal appearance. She is well-developed and well-groomed. She is obese. She is not ill-appearing or toxic-appearing.  HENT:     Head: Normocephalic and atraumatic.     Nose:     Comments: Dawn Skinner is in place, covering mouth and nose. Eyes:     General: Lids are normal. Vision grossly intact.        Right eye: No discharge.        Left eye: No discharge.     Extraocular Movements: Extraocular movements intact.     Conjunctiva/sclera: Conjunctivae normal.     Pupils: Pupils are equal, round, and reactive to light.  Cardiovascular:     Pulses: Normal pulses.          Dorsalis pedis pulses are 2+ on the right  side and 2+ on the left side.  Pulmonary:     Effort: Pulmonary effort is normal. No respiratory distress.  Musculoskeletal:     Right lower leg: No edema.     Left lower leg: No edema.  Skin:    General: Skin is warm and dry.     Capillary Refill: Capillary refill takes less than 2 seconds.     Findings: Rash present.     Comments: Linear excoriations in fingerwebs, between toes and around neck  Neurological:     General: No focal deficit present.     Mental Status: She is alert and oriented to person, place, and time.  Psychiatric:        Attention and Perception: Attention and perception normal.        Mood and Affect: Mood and affect normal.        Speech: Speech normal.        Behavior: Behavior normal. Behavior is cooperative.        Thought Content: Thought content normal.        Cognition and Memory: Cognition and memory normal.        Judgment:  Judgment normal.    Results for orders placed or performed in visit on 01/27/20  POCT HgB A1C  Result Value Ref Range   Hemoglobin A1C 5.2 4.0 - 5.6 %      Assessment & Plan:   Problem List Items Addressed This Visit      Cardiovascular and Mediastinum   Hypertension    Has been previously diagnosed with HTN at her previous PCP, off medication for years.  Has been having headaches 2-3x per day for a few months.  Does not have access to a BP cuff at home, will order BP monitoring kit, have patient monitor BP for the next 2 weeks and then RTC for evaluation of BP and probable starting medication.  Have labs drawn.  Plan: 1. Begin taking your blood pressure twice per day, logging, and bring that log back to your follow up visit in 2 weeks 2. Increase your water and decrease your sodium intake 3. Have labs drawn in the next 1-2 weeks 4. RTC in 2 weeks      Relevant Medications   Blood Pressure KIT   Other Relevant Orders   CBC with Differential   COMPLETE METABOLIC PANEL WITH GFR     Musculoskeletal and Integument   Scabies    Will treat with permethrin 5% ointment, applying topical layer from neck to toes and leaving in place for 8-14 hours before washing this off.  Be sure to wash all bedding, pillow cases and sheets after washing off ointment.  Can repeat dosing 7 days if having continued symptoms.  Plan: 1. BEGIN permethrin 5% ointment and can repeat in 7 days 2. RTC in 2 weeks      Relevant Medications   permethrin (ELIMITE) 5 % cream     Other   Encounter to establish care with new doctor - Primary    New patient establishment at [facility] for primary care services.  Plan: 1. Baseline labs to be drawn and RTC in 2 weeks for follow up visit      Hyperglycemia    Elevated glucose on labs within the ER, POCT A1C in clinic 5.2%.  Discussed results with patient in clinic.      Relevant Orders   POCT HgB A1C (Completed)    Other Visit Diagnoses    Long-term use of  high-risk medication       Relevant Orders   Lipid Profile   Weight gain       Relevant Orders   Thyroid Panel With TSH      Meds ordered this encounter  Medications  . Blood Pressure KIT    Sig: Please allow patient to choose from what is covered by insurance    Dispense:  1 kit    Refill:  0  . permethrin (ELIMITE) 5 % cream    Sig: Apply 1 application topically once for 1 dose. Leave on for 8-14 hours before showering    Dispense:  60 g    Refill:  1    Follow up plan: Return in about 2 weeks (around 02/10/2020) for HTN F/U.   Harlin Rain, [place] Family Nurse Practitioner [facility] 01/27/2020, 4:48 PM

## 2020-01-27 NOTE — Assessment & Plan Note (Signed)
Has been previously diagnosed with HTN at her previous PCP, off medication for years.  Has been having headaches 2-3x per day for a few months.  Does not have access to a BP cuff at home, will order BP monitoring kit, have patient monitor BP for the next 2 weeks and then RTC for evaluation of BP and probable starting medication.  Have labs drawn.  Plan: 1. Begin taking your blood pressure twice per day, logging, and bring that log back to your follow up visit in 2 weeks 2. Increase your water and decrease your sodium intake 3. Have labs drawn in the next 1-2 weeks 4. RTC in 2 weeks

## 2020-01-27 NOTE — Assessment & Plan Note (Signed)
Elevated glucose on labs within the ER, POCT A1C in clinic 5.2%.  Discussed results with patient in clinic.

## 2020-01-27 NOTE — Patient Instructions (Signed)
As we discussed, take your blood pressure twice per day for the next 2 weeks, we will plan to see you back in 2 weeks to review your blood pressure log and make a determination of what blood pressure medication is needed, if any.  This can also be contributing to your headaches.  Be sure to increase your water intake and decrease your sodium intake.  I have sent in a prescription for permetherin 5% to apply from your neck to your toes, covering your entire body and leave on for 8-14 hours before showering.    Have your labs drawn in the next 1-2 weeks and we will contact you with the results.  Try to get exercise a minimum of 30 minutes per day at least 5 days per week as well as  adequate water intake all while measuring blood pressure a few times per week.  Keep a blood pressure log and bring back to clinic at your next visit.  If your readings are consistently over 130/80 to contact our office/send me a MyChart message and we will see you sooner.  Can try DASH and Mediterranean diet options, avoiding processed foods, lowering sodium intake, avoiding pork products, and eating a plant based diet for optimal health.  We will plan to see you back in 2 weeks for hypertension follow up visit  You will receive a survey after today's visit either digitally by e-mail or paper by USPS mail. Your experiences and feedback matter to [place].  Please respond so we know how we are doing as we provide care for you.  Call us with any questions/concerns/needs.  It is my goal to be available to you for your health concerns.  Thanks for choosing me to be a partner in your healthcare needs!  Charlaine Dalton, FNP-C Family Nurse Practitioner [facility] Phone: [phone]

## 2020-01-27 NOTE — Assessment & Plan Note (Signed)
New patient establishment at [facility] for primary care services.  Plan: 1. Baseline labs to be drawn and RTC in 2 weeks for follow up visit

## 2020-01-27 NOTE — Assessment & Plan Note (Signed)
Will treat with permethrin 5% ointment, applying topical layer from neck to toes and leaving in place for 8-14 hours before washing this off.  Be sure to wash all bedding, pillow cases and sheets after washing off ointment.  Can repeat dosing 7 days if having continued symptoms.  Plan: 1. BEGIN permethrin 5% ointment and can repeat in 7 days 2. RTC in 2 weeks

## 2020-01-29 ENCOUNTER — Encounter: Payer: Self-pay | Admitting: Family Medicine

## 2020-02-02 ENCOUNTER — Other Ambulatory Visit: Payer: Medicaid Other

## 2020-02-02 ENCOUNTER — Other Ambulatory Visit: Payer: Self-pay

## 2020-02-02 DIAGNOSIS — R739 Hyperglycemia, unspecified: Secondary | ICD-10-CM | POA: Diagnosis not present

## 2020-02-02 DIAGNOSIS — I10 Essential (primary) hypertension: Secondary | ICD-10-CM | POA: Diagnosis not present

## 2020-02-02 DIAGNOSIS — R635 Abnormal weight gain: Secondary | ICD-10-CM | POA: Diagnosis not present

## 2020-02-02 DIAGNOSIS — Z79899 Other long term (current) drug therapy: Secondary | ICD-10-CM | POA: Diagnosis not present

## 2020-02-03 LAB — COMPLETE METABOLIC PANEL WITH GFR
AG Ratio: 1.9 (calc) (ref 1.0–2.5)
ALT: 10 U/L (ref 6–29)
AST: 14 U/L (ref 10–30)
Albumin: 4.3 g/dL (ref 3.6–5.1)
Alkaline phosphatase (APISO): 50 U/L (ref 31–125)
BUN: 9 mg/dL (ref 7–25)
CO2: 23 mmol/L (ref 20–32)
Calcium: 9.2 mg/dL (ref 8.6–10.2)
Chloride: 107 mmol/L (ref 98–110)
Creat: 0.98 mg/dL (ref 0.50–1.10)
GFR, Est African American: 90 mL/min/{1.73_m2} (ref >=60)
GFR, Est Non African American: 78 mL/min/{1.73_m2} (ref >=60)
Globulin: 2.3 g/dL (calc) (ref 1.9–3.7)
Glucose, Bld: 116 mg/dL — ABNORMAL HIGH (ref 65–99)
Potassium: 3.9 mmol/L (ref 3.5–5.3)
Sodium: 138 mmol/L (ref 135–146)
Total Bilirubin: 0.6 mg/dL (ref 0.2–1.2)
Total Protein: 6.6 g/dL (ref 6.1–8.1)

## 2020-02-03 LAB — LIPID PANEL
Cholesterol: 160 mg/dL (ref <200)
HDL: 45 mg/dL — ABNORMAL LOW (ref >=50)
LDL Cholesterol (Calc): 99 mg/dL (calc)
Non-HDL Cholesterol (Calc): 115 mg/dL (calc) (ref <130)
Total CHOL/HDL Ratio: 3.6 (calc) (ref <5.0)
Triglycerides: 73 mg/dL (ref <150)

## 2020-02-03 LAB — HEMOGLOBIN A1C
Hgb A1c MFr Bld: 5.4 % of total Hgb (ref <5.7)
Mean Plasma Glucose: 108 (calc)
eAG (mmol/L): 6 (calc)

## 2020-02-03 LAB — CBC WITH DIFFERENTIAL/PLATELET
Absolute Monocytes: 395 cells/uL (ref 200–950)
Basophils Absolute: 40 cells/uL (ref 0–200)
Basophils Relative: 0.5 %
Eosinophils Absolute: 198 cells/uL (ref 15–500)
Eosinophils Relative: 2.5 %
HCT: 43.6 % (ref 35.0–45.0)
Hemoglobin: 13.8 g/dL (ref 11.7–15.5)
Lymphs Abs: 2267 cells/uL (ref 850–3900)
MCH: 27.5 pg (ref 27.0–33.0)
MCHC: 31.7 g/dL — ABNORMAL LOW (ref 32.0–36.0)
MCV: 86.9 fL (ref 80.0–100.0)
MPV: 11.2 fL (ref 7.5–12.5)
Monocytes Relative: 5 %
Neutro Abs: 5001 cells/uL (ref 1500–7800)
Neutrophils Relative %: 63.3 %
Platelets: 188 10*3/uL (ref 140–400)
RBC: 5.02 10*6/uL (ref 3.80–5.10)
RDW: 13.2 % (ref 11.0–15.0)
Total Lymphocyte: 28.7 %
WBC: 7.9 10*3/uL (ref 3.8–10.8)

## 2020-02-03 LAB — THYROID PANEL WITH TSH
Free Thyroxine Index: 2.1 (ref 1.4–3.8)
T3 Uptake: 30 % (ref 22–35)
T4, Total: 7 ug/dL (ref 5.1–11.9)
TSH: 0.85 mIU/L

## 2020-02-10 ENCOUNTER — Ambulatory Visit (INDEPENDENT_AMBULATORY_CARE_PROVIDER_SITE_OTHER): Payer: Medicaid Other | Admitting: Family Medicine

## 2020-02-10 ENCOUNTER — Encounter: Payer: Self-pay | Admitting: Family Medicine

## 2020-02-10 ENCOUNTER — Other Ambulatory Visit: Payer: Self-pay

## 2020-02-10 VITALS — BP 132/77 | HR 62 | Ht 65.5 in | Wt 205.6 lb

## 2020-02-10 DIAGNOSIS — F5104 Psychophysiologic insomnia: Secondary | ICD-10-CM

## 2020-02-10 DIAGNOSIS — F419 Anxiety disorder, unspecified: Secondary | ICD-10-CM

## 2020-02-10 MED ORDER — HYDROXYZINE HCL 10 MG PO TABS: 10.0000 mg | ORAL_TABLET | Freq: Three times a day (TID) | ORAL | 0 refills | Status: DC | PRN

## 2020-02-10 MED ORDER — TRAZODONE HCL 50 MG PO TABS: 25.0000 mg | ORAL_TABLET | Freq: Every evening | ORAL | 1 refills | Status: DC | PRN

## 2020-02-10 MED ORDER — ESCITALOPRAM OXALATE 10 MG PO TABS: 10.0000 mg | ORAL_TABLET | Freq: Every day | ORAL | 1 refills | Status: DC

## 2020-02-10 NOTE — Patient Instructions (Addendum)
I have sent in a prescription for escitalopram $RemoveBeforeDE'10mg'cuJaDdLPdywgVJL$  to take 1 tablet daily.  I have sent in a prescription for hydroxyzine $RemoveBeforeD'10mg'uKISBrmyoCsngl$  to take 1/2 to 1 tablet up to 3x per day as needed for anxiety.  Be sure to take your first dose when you are at home as this can be sedating.  Be sure not to drive or operate heavy machinery until you know how this medication effects you.  I have sent in a prescription for trazodone $RemoveBefor'50mg'GkgsEzVWLASp$  to take 1/2 to 1 tablet an hour before bed to help with insomnia.  I have placed a referral for psychiatry.  The practice we had spoken about that has providers on staff that complete genetic testing to find out which medications may be the most helpful is [facility].  They have locations all over [place].  Their [facility] office is [phone] and they do offer telemedicine appointments.  If [facility] is not the right fit for you, there are these other providers listed below.  Please contact one of these locations below to schedule an appointment for psychiatric care  [facility] [address] [place], [place] [address] Phone# [phone]  [facility] [place], [place] [address] Phone# [phone]  [facility] [address] [place], [place] [address] Phone# [phone]  [place], [place] [address] Phone# [phone]   The following recommendations are helpful adjuncts for helping rebalance your mood.  Eat a nourishing diet. Ensure adequate intake of calories, protein, carbs, fat, vitamins, and minerals. Prioritize whole foods at each meal, including meats, vegetables, fruits, nuts and seeds, etc.   Avoid inflammatory and/or "junk" foods, such as sugar, omega-6 fats, refined grains, chemicals, and preservatives are common in packaged and prepared foods. Minimize or completely avoid these ingredients and stick to whole foods with little to no additives. Cook from  scratch as much as possible for more control over what you eat  Get enough sleep. Poor sleep is significantly associated with depression and anxiety. Make 7-9 hours of sleep nightly a top priority  Exercise appropriately. Exercise is known to improve brain functioning and boost mood. Aim for 30 minutes of daily physical activity. Avoid "overtraining," which can cause mental disturbances  Assess your light exposure. Not enough natural light during the day and too much artificial light can have a major impact on your mood. Get outside as often as possible during daylight hours. Minimize light exposure after dark and avoid the use of electronics that give off blue light before bed  Manage your stress.  Use daily stress management techniques such as meditation, yoga, or mindfulness to retrain your brain to respond differently to stress. Try deep breathing to deactivate your "fight or flight" response.  There are many of sources with apps like Headspace, Calm or a variety of YouTube videos (videos from Gwynne Edinger have guided meditation)  Prioritize your social life. Work on building social support with new friends or improve current relationships. Consider getting a pet that allows for companionship, social interaction, and physical touch. Try volunteering or joining a faith-based community to increase your sense of purpose  4-7-8 breathing technique at bedtime: breathe in to count of 4, hold breath for count of 7, exhale for count of 8; do 3-5 times for letting go of overactive thoughts  Take time to play Unstructured "play" time can help reduce anxiety and depression Options for play include music, games, sports, dance, art, etc.  Try to add daily omega 3 fatty acids, magnesium, B complex, and balanced amino acid supplements to help improve mood and anxiety.  Sleep hygiene is the single most effective treatment for sleep issues, but it is hard work.  Tips for a good night's sleep:  -Keep  sleep environment comfortable and conducive to sleep -Keep regular sleep schedule 7 nights a week -Avoiding naps during the day -Avoiding going to bed until drowsy and ready to sleep, not trying to sleep, and not watching the clock -Get out of bed if not asleep within 15-20 minutes and returning only when drowsy -Avoiding caffeine, nicotine, alcohol, and other substances that interfere with sleep before bedtime -Take an hour before your set bedtime and start to wind down: bath/shower, no more TV or phone (the blue light can interfere with sleeping), listen to soothing music, or meditation -No TV in your bedroom -Exercising regularly, at least 6 hours before sleep. Yoga and Tai Chi can improve sleep quality  There are a lot of books and apps that may help guide you with any of the following:   -Progressive muscle relaxation (involves methodical tension and relaxation of different Muscle groups throughout body)  Guided imagery  -YouTube - Gwynne Edinger has free videos on YouTube that can help with meditation and some   Abdominal breathing   Over the counter sleep aid one hour before bed- and gradually wean your use over 2-4 weeks  Some examples are : *Melatonin 5-10 mg *Sleepology (Can find on [employer]) taken according to packaging directions  There are a few online evidence based online programs, unfortunately they are not free.   Developed by a sleep expert who created a drug-free program for insomnia proven more effective than sleeping pills.  www.cbtforinsomnia.com Sleepio is an evidence-based digital sleep improvement program   www.sleepio.com SHUTi is designed to actively help retrain your body and mind for great sleep through six engaging Cognitive Behavioral Therapy for Insomnia strategy and learning sessions  [email or web address]  We will plan to see you back in 4 weeks for anxiety follow up visit  You will receive a survey after today's visit either digitally by  e-mail or paper by [place] mail. Your experiences and feedback matter to [place].  Please respond so we know how we are doing as we provide care for you.  Call us with any questions/concerns/needs.  It is my goal to be available to you for your health concerns.  Thanks for choosing me to be a partner in your healthcare needs!  Harlin Rain, FNP-C Family Nurse Practitioner [facility] Phone: [phone]

## 2020-02-10 NOTE — Progress Notes (Signed)
Subjective:    Patient ID: Dawn Skinner, female    DOB: 29-Jan-1991, 29 y.o.   MRN: [id number]  Dawn Skinner is a 29 y.o. female presenting on 02/10/2020 for Hypertension and Rash   HPI  Ms. Waltz presents to clinic with concerns of elevated blood pressure, skin itching and dizziness.  Reports she has been taking her blood pressure at home and it has been 130's/70's, has some skin itching, feels overwhelmed at times, is not sleeping much.  Has concerns that she may have anxiety.  Has not been diagnosed with anxiety in the past, denies any previous counseling or therapy in the past.  Denies BP readings > 140/80.    Depression screen PHQ 2/9 01/27/2020  Decreased Interest 0  Down, Depressed, Hopeless 0  PHQ - 2 Score 0    Social History   Tobacco Use  . Smoking status: Never Smoker  . Smokeless tobacco: Never Used  Vaping Use  . Vaping Use: Never used  Substance Use Topics  . Alcohol use: Yes    Alcohol/week: 3.0 standard drinks    Types: 3 Glasses of wine per week    Comment: 2-3 weekly   . Drug use: No    Review of Systems  Constitutional: Negative.   HENT: Negative.   Eyes: Negative.   Respiratory: Negative.   Cardiovascular: Negative.   Gastrointestinal: Negative.   Endocrine: Negative.   Genitourinary: Negative.   Musculoskeletal: Negative.   Skin: Negative.   Allergic/Immunologic: Negative.   Neurological: Negative.   Hematological: Negative.   Psychiatric/Behavioral: Positive for sleep disturbance. Negative for agitation, behavioral problems, confusion, decreased concentration, dysphoric mood, hallucinations, self-injury and suicidal ideas. The patient is nervous/anxious. The patient is not hyperactive.    Per HPI unless specifically indicated above     Objective:    BP 132/77 (BP Location: Left Arm, Patient Position: Sitting, Cuff Size: Normal)   Pulse 62   Ht 5' 5.5" (1.664 m)   Wt 205 lb 9.6 oz (93.3 kg)   BMI 33.69 kg/m   Wt Readings from Last  3 Encounters:  02/10/20 205 lb 9.6 oz (93.3 kg)  01/27/20 204 lb 6.4 oz (92.7 kg)  01/07/20 208 lb 8.9 oz (94.6 kg)    Physical Exam Vitals and nursing note reviewed.  Constitutional:      General: She is not in acute distress.    Appearance: Normal appearance. She is well-developed and well-groomed. She is obese. She is not ill-appearing or toxic-appearing.  HENT:     Head: Normocephalic and atraumatic.     Nose:     Comments: Lesia Sago is in place, covering mouth and nose. Eyes:     General: Lids are normal. Vision grossly intact.        Right eye: No discharge.        Left eye: No discharge.     Extraocular Movements: Extraocular movements intact.     Conjunctiva/sclera: Conjunctivae normal.     Pupils: Pupils are equal, round, and reactive to light.  Cardiovascular:     Pulses: Normal pulses.  Pulmonary:     Effort: Pulmonary effort is normal. No respiratory distress.  Musculoskeletal:     Right lower leg: No edema.     Left lower leg: No edema.  Skin:    General: Skin is warm and dry.     Capillary Refill: Capillary refill takes less than 2 seconds.  Neurological:     General: No focal deficit present.     Mental  Status: She is alert and oriented to person, place, and time.  Psychiatric:        Attention and Perception: Attention and perception normal.        Mood and Affect: Mood is anxious and depressed. Affect is tearful.        Speech: Speech normal.        Behavior: Behavior normal. Behavior is cooperative.        Thought Content: Thought content normal.        Cognition and Memory: Cognition and memory normal.        Judgment: Judgment normal.    Results for orders placed or performed in visit on 01/27/20  CBC with Differential  Result Value Ref Range   WBC 7.9 3.8 - 10.8 Thousand/uL   RBC 5.02 3.80 - 5.10 Million/uL   Hemoglobin 13.8 11.7 - 15.5 g/dL   HCT 16.1 35 - 45 %   MCV 86.9 80.0 - 100.0 fL   MCH 27.5 27.0 - 33.0 pg   MCHC 31.7 (L) 32.0 - 36.0  g/dL   RDW 09.6 04.5 - 40.9 %   Platelets 188 140 - 400 Thousand/uL   MPV 11.2 7.5 - 12.5 fL   Neutro Abs 5,001 1,500 - 7,800 cells/uL   Lymphs Abs 2,267 850 - 3,900 cells/uL   Absolute Monocytes 395 200 - 950 cells/uL   Eosinophils Absolute 198 15 - 500 cells/uL   Basophils Absolute 40 0 - 200 cells/uL   Neutrophils Relative % 63.3 %   Total Lymphocyte 28.7 %   Monocytes Relative 5.0 %   Eosinophils Relative 2.5 %   Basophils Relative 0.5 %  COMPLETE METABOLIC PANEL WITH GFR  Result Value Ref Range   Glucose, Bld 116 (H) 65 - 99 mg/dL   BUN 9 7 - 25 mg/dL   Creat 8.11 9.14 - 7.82 mg/dL   GFR, Est Non African American 78 > OR = 60 mL/min/1.54m2   GFR, Est African American 90 > OR = 60 mL/min/1.9m2   BUN/Creatinine Ratio NOT APPLICABLE 6 - 22 (calc)   Sodium 138 135 - 146 mmol/L   Potassium 3.9 3.5 - 5.3 mmol/L   Chloride 107 98 - 110 mmol/L   CO2 23 20 - 32 mmol/L   Calcium 9.2 8.6 - 10.2 mg/dL   Total Protein 6.6 6.1 - 8.1 g/dL   Albumin 4.3 3.6 - 5.1 g/dL   Globulin 2.3 1.9 - 3.7 g/dL (calc)   AG Ratio 1.9 1.0 - 2.5 (calc)   Total Bilirubin 0.6 0.2 - 1.2 mg/dL   Alkaline phosphatase (APISO) 50 31 - 125 U/L   AST 14 10 - 30 U/L   ALT 10 6 - 29 U/L  Lipid Profile  Result Value Ref Range   Cholesterol 160 <200 mg/dL   HDL 45 (L) > OR = 50 mg/dL   Triglycerides 73 <956 mg/dL   LDL Cholesterol (Calc) 99 mg/dL (calc)   Total CHOL/HDL Ratio 3.6 <5.0 (calc)   Non-HDL Cholesterol (Calc) 115 <130 mg/dL (calc)  Thyroid Panel With TSH  Result Value Ref Range   T3 Uptake 30 22 - 35 %   T4, Total 7.0 5.1 - 11.9 mcg/dL   Free Thyroxine Index 2.1 1.4 - 3.8   TSH 0.85 mIU/L  Hemoglobin A1c  Result Value Ref Range   Hgb A1c MFr Bld 5.4 <5.7 % of total Hgb   Mean Plasma Glucose 108 (calc)   eAG (mmol/L) 6.0 (calc)  POCT HgB A1C  Result Value Ref Range   Hemoglobin A1C 5.2 4.0 - 5.6 %      Assessment & Plan:   Problem List Items Addressed This Visit      Other   Anxiety  - Primary    GAD-19, reports insomnia, feeling like an impending sense of doom, like her head is going to explode at times.  Discussed daily vs PRN medications, believes if Rx'd PRN would use daily.  Will start on daily SSRI, reviewed sertraline, fluoxetine and escitalopram and agreeable to starting on escitalopram as a daily medication.  Will start on hydroxyzine 5-10mg  TID PRN for anxiety and skin picking.  Mood handout provided.  Referral to therapy/counseling placed.  Plan: 1. Begin escitalopram 10mg  daily 2. Begin hydroxyzine 5-10mg  TID PRN for anxiety 3. Review mood handout 4. Contact local resources to establish for counseling/therapy 5. RTC in 4 weeks      Relevant Medications   hydrOXYzine (ATARAX/VISTARIL) 10 MG tablet   escitalopram (LEXAPRO) 10 MG tablet   traZODone (DESYREL) 50 MG tablet   Other Relevant Orders   Ambulatory referral to Psychiatry   Psychophysiological insomnia    Reports has not been sleeping well, likely secondary to anxiety.  Will start on trazodone 25-50mg  1 hour before sleep.  Sleep hygiene handout provided.  Plan: 1. Begin trazodone 25-50mg  1 hour before bedtime 2. Review sleep hygiene handout 3. Work towards building your own sleep hygiene routine 4. RTC in 4 weeks         Meds ordered this encounter  Medications  . hydrOXYzine (ATARAX/VISTARIL) 10 MG tablet    Sig: Take 1 tablet (10 mg total) by mouth 3 (three) times daily as needed.    Dispense:  30 tablet    Refill:  0  . escitalopram (LEXAPRO) 10 MG tablet    Sig: Take 1 tablet (10 mg total) by mouth daily.    Dispense:  90 tablet    Refill:  1  . traZODone (DESYREL) 50 MG tablet    Sig: Take 0.5-1 tablets (25-50 mg total) by mouth at bedtime as needed for sleep.    Dispense:  30 tablet    Refill:  1    Follow up plan: Return in about 4 weeks (around 03/09/2020) for Anxiety follow up visit.   03/11/2020, FNP Family Nurse Practitioner [facility] 02/10/2020, 4:51 PM

## 2020-02-10 NOTE — Assessment & Plan Note (Signed)
Reports has not been sleeping well, likely secondary to anxiety.  Will start on trazodone 25-50mg  1 hour before sleep.  Sleep hygiene handout provided.  Plan: 1. Begin trazodone 25-50mg  1 hour before bedtime 2. Review sleep hygiene handout 3. Work towards building your own sleep hygiene routine 4. RTC in 4 weeks

## 2020-02-10 NOTE — Assessment & Plan Note (Signed)
GAD-19, reports insomnia, feeling like an impending sense of doom, like her head is going to explode at times.  Discussed daily vs PRN medications, believes if Rx'd PRN would use daily.  Will start on daily SSRI, reviewed sertraline, fluoxetine and escitalopram and agreeable to starting on escitalopram as a daily medication.  Will start on hydroxyzine 5-10mg  TID PRN for anxiety and skin picking.  Mood handout provided.  Referral to therapy/counseling placed.  Plan: 1. Begin escitalopram 10mg  daily 2. Begin hydroxyzine 5-10mg  TID PRN for anxiety 3. Review mood handout 4. Contact local resources to establish for counseling/therapy 5. RTC in 4 weeks

## 2020-02-19 ENCOUNTER — Ambulatory Visit: Payer: Medicaid Other

## 2020-03-09 ENCOUNTER — Ambulatory Visit: Payer: Medicaid Other | Admitting: Family Medicine

## 2020-03-12 ENCOUNTER — Ambulatory Visit: Payer: Medicaid Other | Admitting: Family Medicine

## 2020-03-28 DIAGNOSIS — R112 Nausea with vomiting, unspecified: Secondary | ICD-10-CM | POA: Diagnosis not present

## 2020-03-28 DIAGNOSIS — F430 Acute stress reaction: Secondary | ICD-10-CM | POA: Diagnosis not present

## 2020-03-28 DIAGNOSIS — Z8659 Personal history of other mental and behavioral disorders: Secondary | ICD-10-CM | POA: Diagnosis not present

## 2020-03-28 DIAGNOSIS — Z20822 Contact with and (suspected) exposure to covid-19: Secondary | ICD-10-CM | POA: Diagnosis not present

## 2020-03-31 ENCOUNTER — Emergency Department: Payer: Medicaid Other

## 2020-03-31 ENCOUNTER — Other Ambulatory Visit: Payer: Self-pay

## 2020-03-31 ENCOUNTER — Emergency Department
Admission: EM | Admit: 2020-03-31 | Discharge: 2020-03-31 | Disposition: A | Discharge status: Home / Self Care | Payer: Medicaid Other | Attending: Emergency Medicine | Admitting: Emergency Medicine

## 2020-03-31 DIAGNOSIS — R079 Chest pain, unspecified: Secondary | ICD-10-CM | POA: Diagnosis not present

## 2020-03-31 DIAGNOSIS — R0789 Other chest pain: Secondary | ICD-10-CM | POA: Diagnosis not present

## 2020-03-31 DIAGNOSIS — I10 Essential (primary) hypertension: Secondary | ICD-10-CM | POA: Insufficient documentation

## 2020-03-31 LAB — CBC
HCT: 44.6 % (ref 36.0–46.0)
Hemoglobin: 14.8 g/dL (ref 12.0–15.0)
MCH: 27.7 pg (ref 26.0–34.0)
MCHC: 33.2 g/dL (ref 30.0–36.0)
MCV: 83.5 fL (ref 80.0–100.0)
Platelets: 217 10*3/uL (ref 150–400)
RBC: 5.34 MIL/uL — ABNORMAL HIGH (ref 3.87–5.11)
RDW: 14.6 % (ref 11.5–15.5)
WBC: 9.6 10*3/uL (ref 4.0–10.5)
nRBC: 0 % (ref 0.0–0.2)

## 2020-03-31 LAB — BASIC METABOLIC PANEL
Anion gap: 13 (ref 5–15)
BUN: 8 mg/dL (ref 6–20)
CO2: 23 mmol/L (ref 22–32)
Calcium: 9.4 mg/dL (ref 8.9–10.3)
Chloride: 99 mmol/L (ref 98–111)
Creatinine, Ser: 1.02 mg/dL — ABNORMAL HIGH (ref 0.44–1.00)
GFR, Estimated: >60 mL/min (ref >60)
Glucose, Bld: 91 mg/dL (ref 70–99)
Potassium: 4.1 mmol/L (ref 3.5–5.1)
Sodium: 135 mmol/L (ref 135–145)

## 2020-03-31 LAB — TROPONIN I (HIGH SENSITIVITY)
Troponin I (High Sensitivity): <2 ng/L (ref <18)
Troponin I (High Sensitivity): 2 ng/L (ref <18)

## 2020-03-31 MED ORDER — IBUPROFEN 600 MG PO TABS: 600.0000 mg | ORAL_TABLET | Freq: Four times a day (QID) | ORAL | 0 refills | Status: DC | PRN

## 2020-03-31 MED ORDER — KETOROLAC TROMETHAMINE 30 MG/ML IJ SOLN
30.0000 mg | Freq: Once | INTRAMUSCULAR | Status: AC
  Administered 2020-03-31: 30 mg via INTRAVENOUS
  Filled 2020-03-31: qty 1

## 2020-03-31 NOTE — ED Triage Notes (Signed)
Reports sudden onset of centralized CP, "someone sitting on top of me" X 10 minutes ago.

## 2020-03-31 NOTE — ED Provider Notes (Signed)
[facility] Emergency Department Provider Note ____________________________________________   First MD Initiated Contact with Patient 03/31/20 0957     (approximate)  I have reviewed the triage vital signs and the nursing notes.   HISTORY  Chief Complaint Chest Pain    HPI Dawn Skinner is a 29 y.o. female with PMH as noted below who presents with chest pain, acute onset about 30 minutes prior to arrival, constant since then, substernal, and described both as pressure-like and sharp.  It is somewhat radiating to her back.  It is reproducible with palpation and with movement of her upper body.  She reports mild associated shortness of breath and lightheadedness.  She denies any prior history of this pain.  She has had no recent changes in her routine.  The pain is not exertional.  It started when she was arriving at school.  Past Medical History:  Diagnosis Date  . Frequent headaches   . Hypertension   . Iron deficiency anemia     Patient Active Problem List   Diagnosis Date Noted  . Anxiety 02/10/2020  . 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 Encounter to establish care with new doctor 01/27/2020  . Hyperglycemia 01/27/2020  . Scabies 01/27/2020  . Hypertension 11/06/2018  . Vertigo 11/06/2018    Past Surgical History:  Procedure Laterality Date  . NO PAST SURGERIES      Prior to Admission medications   Medication Sig Start Date End Date Taking? Authorizing Provider  Blood Pressure KIT Please allow patient to choose from what is covered by insurance Patient not taking: Reported on 02/10/2020 01/27/20   Verl Bangs, FNP  escitalopram (LEXAPRO) 10 MG tablet Take 1 tablet (10 mg total) by mouth daily. 02/10/20   Malfi, Lupita Raider, FNP  hydrOXYzine (ATARAX/VISTARIL) 10 MG tablet Take 1 tablet (10 mg total) by mouth 3 (three) times daily as needed. 02/10/20   Malfi, Lupita Raider, FNP  ibuprofen (ADVIL) 600 MG tablet Take 1 tablet (600 mg total) by  mouth every 6 (six) hours as needed. 03/31/20   Arta Silence, MD  Multiple Vitamins-Minerals (MULTIVITAMIN WITH MINERALS) tablet Take 1 tablet by mouth daily. 11/25/19   Caren Macadam, MD  permethrin (ELIMITE) 5 % cream Apply topically once. 01/27/20   [provider]  traZODone (DESYREL) 50 MG tablet Take 0.5-1 tablets (25-50 mg total) by mouth at bedtime as needed for sleep. 02/10/20   Malfi, Lupita Raider, FNP    Allergies Patient has no known allergies.  Family History  Problem Relation Age of Onset  . Hypertension Mother   . Stroke Mother   . Diabetes Mellitus II Mother   . Hypertension Father   . Diabetes Father   . Colon polyps Maternal Grandfather   . Diabetes Paternal Grandmother     Social History Social History   Tobacco Use  . Smoking status: Never Smoker  . Smokeless tobacco: Never Used  Vaping Use  . Vaping Use: Never used  Substance Use Topics  . Alcohol use: Yes    Alcohol/week: 3.0 standard drinks    Types: 3 Glasses of wine per week    Comment: 2-3 weekly   . Drug use: No    Review of Systems  Constitutional: No fever/chills. Eyes: No redness. ENT: No sore throat. Cardiovascular: Positive for chest pain. Respiratory: Positive for mild shortness of breath. Gastrointestinal: No vomiting or diarrhea.  Genitourinary: Negative for dysuria.  Musculoskeletal: Positive for back pain. Skin: Negative for rash. Neurological: Negative for  headache.   ____________________________________________   PHYSICAL EXAM:  VITAL SIGNS: ED Triage Vitals  Enc Vitals Group     BP 03/31/20 0937 (!) 167/112     Pulse Rate 03/31/20 0937 85     Resp 03/31/20 0937 (!) 22     Temp 03/31/20 0937 97.9 F (36.6 C)     Temp Source 03/31/20 0937 Oral     SpO2 03/31/20 0937 100 %     Weight 03/31/20 0936 200 lb (90.7 kg)     Height 03/31/20 0936 5' 6" (1.676 m)     Head Circumference --      Peak Flow --      Pain Score 03/31/20 0936 8     Pain Loc  --      Pain Edu? --      Excl. in GC? --     Constitutional: Alert and oriented. Well appearing and in no acute distress. Eyes: Conjunctivae are normal.  Head: Atraumatic. Nose: No congestion/rhinnorhea. Mouth/Throat: Mucous membranes are moist.   Neck: Normal range of motion.  Cardiovascular: Normal rate, regular rhythm. Grossly normal heart sounds.  Good peripheral circulation. Respiratory: Normal respiratory effort.  No retractions. Lungs CTAB. Gastrointestinal: No distention.  Musculoskeletal: No lower extremity edema.  Extremities warm and well perfused.  Reproducible anterior chest wall tenderness. Neurologic:  Normal speech and language. No gross focal neurologic deficits are appreciated.  Skin:  Skin is warm and dry. No rash noted. Psychiatric: Mood and affect are normal. Speech and behavior are normal.  ____________________________________________   LABS (all labs ordered are listed, but only abnormal results are displayed)  Labs Reviewed  BASIC METABOLIC PANEL - Abnormal; Notable for the following components:      Result Value   Creatinine, Ser 1.02 (*)    All other components within normal limits  CBC - Abnormal; Notable for the following components:   RBC 5.34 (*)    All other components within normal limits  POC URINE PREG, ED  TROPONIN I (HIGH SENSITIVITY)  TROPONIN I (HIGH SENSITIVITY)   ____________________________________________  EKG  ED ECG REPORT I, Sebastian Siadecki, the attending physician, personally viewed and interpreted this ECG.  Date: 03/31/2020 EKG Time: 0939 Rate: 70 Rhythm: normal sinus rhythm QRS Axis: normal Intervals: normal ST/T Wave abnormalities: normal Narrative Interpretation: no evidence of acute ischemia  ____________________________________________  RADIOLOGY  CXR interpreted by me shows no focal infiltrate or edema  ____________________________________________   PROCEDURES  Procedure(s) performed: No   Procedures  Critical Care performed: No ____________________________________________   INITIAL IMPRESSION / ASSESSMENT AND PLAN / ED COURSE  Pertinent labs & imaging results that were available during my care of the patient were reviewed by me and considered in my medical decision making (see chart for details).  29-year-old female with PMH as noted above presents with acute onset of atypical chest pain which is reproducible with palpation and movement.  On exam, the patient is uncomfortable appearing but in no acute distress.  Her vital signs are normal except for hypertension.  The chest wall is very tender, and palpating it reproduces the pain she is having.  It is also exacerbated with any movement of her upper body.  Physical exam is otherwise unremarkable.  Overall presentation is consistent with musculoskeletal pain in this young and otherwise healthy patient.  The patient has a documented history of hypertension although is not on any medication currently.  Even so, her overall ACS risk is low.  Her EKG is nonischemic.    She is PERC negative.  There is also no evidence of aortic dissection or other vascular cause.  Although the pain does radiate somewhat to the back, given the location and reproducible nature, it is not consistent with vascular etiology.  We will obtain basic labs, troponins x2, give analgesia, and reassess.  ----------------------------------------- 1:10 PM on 03/31/2020 -----------------------------------------  Initial and repeat troponin are both negative.  The patient reports marked improvement in her pain after the Toradol.  She appears much more comfortable.  Her blood pressure has normalized.  At this time, she is stable for discharge home.  Return precautions given, and she expresses understanding.  ____________________________________________   FINAL CLINICAL IMPRESSION(S) / ED DIAGNOSES  Final diagnoses:  Chest wall pain      NEW MEDICATIONS  STARTED DURING THIS VISIT:  New Prescriptions   IBUPROFEN (ADVIL) 600 MG TABLET    Take 1 tablet (600 mg total) by mouth every 6 (six) hours as needed.     Note:  This document was prepared using Dragon voice recognition software and may include unintentional dictation errors.    Siadecki, Sebastian, MD 03/31/20 1311  

## 2020-03-31 NOTE — Discharge Instructions (Signed)
Take the ibuprofen up to every 6 hours with meals as needed for the chest pain.  Return to the ER for new, worsening, or persistent severe pain, difficulty breathing, weakness or lightheadedness, or any other new or worsening symptoms that concern you.

## 2020-04-23 ENCOUNTER — Ambulatory Visit: Payer: Medicaid Other | Admitting: Family Medicine

## 2020-04-27 ENCOUNTER — Encounter: Payer: Self-pay | Admitting: Family Medicine

## 2020-04-27 ENCOUNTER — Ambulatory Visit (INDEPENDENT_AMBULATORY_CARE_PROVIDER_SITE_OTHER): Payer: Medicaid Other | Admitting: Family Medicine

## 2020-04-27 ENCOUNTER — Other Ambulatory Visit: Payer: Self-pay

## 2020-04-27 VITALS — BP 130/93 | HR 71 | Temp 97.7°F | Resp 17 | Ht 66.0 in | Wt 203.6 lb

## 2020-04-27 DIAGNOSIS — F419 Anxiety disorder, unspecified: Secondary | ICD-10-CM

## 2020-04-27 MED ORDER — FLUOXETINE HCL 10 MG PO TABS: 10.0000 mg | ORAL_TABLET | Freq: Every day | ORAL | 1 refills | Status: DC

## 2020-04-27 NOTE — Assessment & Plan Note (Signed)
HTD4-28/JGO1-15.  Has discontinued escitalopram x 1 week ago with worsening of symptoms due to drowsiness.  Discussed restarting on escitalopram and taking before bed vs looking into new medications.  Patient interested in new medication options.  Discussed anxiety and depression as two concerns, reviewed fluoxetine, paroxetine, sertraline as options and patient interested in starting fluoxetine at this time.  To review mood handout.  Discussed if not achieving adequate management of anxiety/depression with fluoxetine, will refer to psychiatry for medication management assistance.  Provided with list of local providers to contact to schedule with and we can send referral.  Pt in agreement with plan  Plan: 1. Start fluoxetine 10mg  daily 2. Review mood handout 3. RTC in 4 weeks for re-evaluation, if inadequate management of anxiety/depression, can refer to psychiatry for medication management assistance

## 2020-04-27 NOTE — Progress Notes (Signed)
Subjective:    Patient ID: Dawn Skinner, female    DOB: September 05, 1990, 29 y.o.   MRN: [id number]  Dawn Skinner is a 29 y.o. female presenting on 04/27/2020 for Anxiety (Pt discontinued her anxiety med (Lexapro) x 1 week ago, because she said it causes drowsiness . )   HPI  Dawn Skinner presents to clinic for concerns of anxiety.  Reports that she was taking lexapro and seeing some improvement, but found that she was having drowsiness during the day.  She has started to take it after work and was drowsy when trying to make dinner and help her children with their school work.  Had not taken before bed to see if would alleviate the drowsiness by the morning.  Has stopped taking this medication approximately 1 week ago.  Reports continued anxiety/depression.  Depression screen [facility] 2/9 04/27/2020 01/27/2020  Decreased Interest 3 0  Down, Depressed, Hopeless 2 0  PHQ - 2 Score 5 0  Altered sleeping 3 -  Tired, decreased energy 3 -  Change in appetite 3 -  Feeling bad or failure about yourself  3 -  Trouble concentrating 3 -  Moving slowly or fidgety/restless 3 -  Suicidal thoughts 0 -  PHQ-9 Score 23 -    Social History   Tobacco Use  . Smoking status: Never Smoker  . Smokeless tobacco: Never Used  Vaping Use  . Vaping Use: Never used  Substance Use Topics  . Alcohol use: Yes    Alcohol/week: 3.0 standard drinks    Types: 3 Glasses of wine per week    Comment: 2-3 weekly   . Drug use: No    Review of Systems  Constitutional: Negative.   HENT: Negative.   Eyes: Negative.   Respiratory: Negative.   Cardiovascular: Negative.   Gastrointestinal: Negative.   Endocrine: Negative.   Genitourinary: Negative.   Musculoskeletal: Negative.   Skin: Negative.   Allergic/Immunologic: Negative.   Neurological: Negative.   Hematological: Negative.   Psychiatric/Behavioral: Positive for dysphoric mood. Negative for agitation, behavioral problems, confusion, decreased concentration,  hallucinations, self-injury, sleep disturbance and suicidal ideas. The patient is nervous/anxious. The patient is not hyperactive.    Per HPI unless specifically indicated above     Objective:    BP (!) 130/93 (BP Location: Right Arm, Patient Position: Sitting, Cuff Size: Large)   Pulse 71   Temp 97.7 F (36.5 C) (Temporal)   Resp 17   Ht 5\' 6"  (1.676 m)   Wt 203 lb 9.6 oz (92.4 kg)   LMP 03/24/2020 (LMP Unknown)   SpO2 99%   BMI 32.86 kg/m   Wt Readings from Last 3 Encounters:  04/27/20 203 lb 9.6 oz (92.4 kg)  03/31/20 200 lb (90.7 kg)  02/10/20 205 lb 9.6 oz (93.3 kg)    Physical Exam Vitals and nursing note reviewed.  Constitutional:      General: She is not in acute distress.    Appearance: Normal appearance. She is well-developed and well-groomed. She is obese. She is not ill-appearing or toxic-appearing.  HENT:     Head: Normocephalic and atraumatic.     Nose:     Comments: 02/12/20 is in place, covering mouth and nose. Eyes:     General: Lids are normal. Vision grossly intact.        Right eye: No discharge.        Left eye: No discharge.     Extraocular Movements: Extraocular movements intact.     Conjunctiva/sclera: Conjunctivae  normal.     Pupils: Pupils are equal, round, and reactive to light.  Cardiovascular:     Pulses: Normal pulses.  Pulmonary:     Effort: Pulmonary effort is normal. No respiratory distress.  Skin:    General: Skin is warm and dry.     Capillary Refill: Capillary refill takes less than 2 seconds.  Neurological:     General: No focal deficit present.     Mental Status: She is alert and oriented to person, place, and time.  Psychiatric:        Attention and Perception: Attention and perception normal.        Mood and Affect: Mood and affect normal.        Speech: Speech normal.        Behavior: Behavior normal. Behavior is cooperative.        Thought Content: Thought content normal.        Cognition and Memory: Cognition and memory  normal.        Judgment: Judgment normal.    Results for orders placed or performed during the hospital encounter of 03/31/20  Basic metabolic panel  Result Value Ref Range   Sodium 135 135 - 145 mmol/L   Potassium 4.1 3.5 - 5.1 mmol/L   Chloride 99 98 - 111 mmol/L   CO2 23 22 - 32 mmol/L   Glucose, Bld 91 70 - 99 mg/dL   BUN 8 6 - 20 mg/dL   Creatinine, Ser 2.42 (H) 0.44 - 1.00 mg/dL   Calcium 9.4 8.9 - 68.3 mg/dL   GFR, Estimated >41 >96 mL/min   Anion gap 13 5 - 15  CBC  Result Value Ref Range   WBC 9.6 4.0 - 10.5 K/uL   RBC 5.34 (H) 3.87 - 5.11 MIL/uL   Hemoglobin 14.8 12.0 - 15.0 g/dL   HCT 22.2 97.9 - 89.2 %   MCV 83.5 80.0 - 100.0 fL   MCH 27.7 26.0 - 34.0 pg   MCHC 33.2 30.0 - 36.0 g/dL   RDW 11.9 41.7 - 40.8 %   Platelets 217 150 - 400 K/uL   nRBC 0.0 0.0 - 0.2 %  Troponin I (High Sensitivity)  Result Value Ref Range   Troponin I (High Sensitivity) <2 <18 ng/L  Troponin I (High Sensitivity)  Result Value Ref Range   Troponin I (High Sensitivity) 2 <18 ng/L      Assessment & Plan:   Problem List Items Addressed This Visit      Other   Anxiety - Primary    PHQ9-23/GAD7-21.  Has discontinued escitalopram x 1 week ago with worsening of symptoms due to drowsiness.  Discussed restarting on escitalopram and taking before bed vs looking into new medications.  Patient interested in new medication options.  Discussed anxiety and depression as two concerns, reviewed fluoxetine, paroxetine, sertraline as options and patient interested in starting fluoxetine at this time.  To review mood handout.  Discussed if not achieving adequate management of anxiety/depression with fluoxetine, will refer to psychiatry for medication management assistance.  Provided with list of local providers to contact to schedule with and we can send referral.  Pt in agreement with plan  Plan: 1. Start fluoxetine 10mg  daily 2. Review mood handout 3. RTC in 4 weeks for re-evaluation, if inadequate  management of anxiety/depression, can refer to psychiatry for medication management assistance      Relevant Medications   FLUoxetine (PROZAC) 10 MG tablet      Meds ordered  this encounter  Medications  . FLUoxetine (PROZAC) 10 MG tablet    Sig: Take 1 tablet (10 mg total) by mouth daily.    Dispense:  30 tablet    Refill:  1    Follow up plan: Return in about 4 weeks (around 05/25/2020) for Anxiety/depression f/u.   Charlaine Dalton, FNP Family Nurse Practitioner [facility] 04/27/2020, 9:34 AM

## 2020-04-27 NOTE — Patient Instructions (Addendum)
I have sent in a prescription for fluoxetine 10mg  to take 1 tablet daily.  If this does not help with your anxiety/depression, we can place a referral for psychiatry for additional help with medication management  The practice we had spoken about that has providers on staff that complete genetic testing to find out which medications may be the most helpful is [facility].  They have locations all over .  Their [facility] office is [phone] and they do offer telemedicine appointments.  If [facility] is not the right fit for you, there are these other providers listed below.  Please contact one of these locations below to schedule an appointment for psychiatric care  [facility] [address] [place], [place] Phone# [phone]  [facility] [address] [place], [place] Phone# [phone]  [facility] [address] [place], [place] Phone# [phone]  [facility] [address] [place], [place] Phone# [phone]  We will plan to see you back in 4 weeks for anxiety/depression follow up visit  You will receive a survey after today's visit either digitally by e-mail or paper by USPS mail. Your experiences and feedback matter to [phone].  Please respond so we know how we are doing as we provide care for you.  Call us with any questions/concerns/needs.  It is my goal to be available to you for your health concerns.  Thanks for choosing me to be a partner in your healthcare needs!  [place], FNP-C Family Nurse Practitioner [facility] Phone: [phone]

## 2020-05-02 ENCOUNTER — Emergency Department: Payer: Medicaid Other

## 2020-05-02 ENCOUNTER — Emergency Department
Admission: EM | Admit: 2020-05-02 | Discharge: 2020-05-02 | Disposition: A | Discharge status: Home / Self Care | Payer: Medicaid Other | Attending: Emergency Medicine | Admitting: Emergency Medicine

## 2020-05-02 ENCOUNTER — Other Ambulatory Visit: Payer: Self-pay

## 2020-05-02 DIAGNOSIS — R079 Chest pain, unspecified: Secondary | ICD-10-CM | POA: Diagnosis not present

## 2020-05-02 DIAGNOSIS — I10 Essential (primary) hypertension: Secondary | ICD-10-CM | POA: Diagnosis not present

## 2020-05-02 DIAGNOSIS — R0789 Other chest pain: Secondary | ICD-10-CM | POA: Diagnosis not present

## 2020-05-02 LAB — HEPATIC FUNCTION PANEL
ALT: 14 U/L (ref 0–44)
AST: 21 U/L (ref 15–41)
Albumin: 4 g/dL (ref 3.5–5.0)
Alkaline Phosphatase: 58 U/L (ref 38–126)
Bilirubin, Direct: <0.1 mg/dL (ref 0.0–0.2)
Total Bilirubin: 0.7 mg/dL (ref 0.3–1.2)
Total Protein: 7.1 g/dL (ref 6.5–8.1)

## 2020-05-02 LAB — TROPONIN I (HIGH SENSITIVITY)
Troponin I (High Sensitivity): 3 ng/L (ref <18)
Troponin I (High Sensitivity): 3 ng/L

## 2020-05-02 LAB — BASIC METABOLIC PANEL
Anion gap: 9 (ref 5–15)
BUN: 12 mg/dL (ref 6–20)
CO2: 23 mmol/L (ref 22–32)
Calcium: 8.9 mg/dL (ref 8.9–10.3)
Chloride: 106 mmol/L (ref 98–111)
Creatinine, Ser: 0.96 mg/dL (ref 0.44–1.00)
GFR, Estimated: >60 mL/min (ref >60)
Glucose, Bld: 90 mg/dL (ref 70–99)
Potassium: 3.7 mmol/L (ref 3.5–5.1)
Sodium: 138 mmol/L (ref 135–145)

## 2020-05-02 LAB — CBC
HCT: 41.6 % (ref 36.0–46.0)
Hemoglobin: 13.5 g/dL (ref 12.0–15.0)
MCH: 27.2 pg (ref 26.0–34.0)
MCHC: 32.5 g/dL (ref 30.0–36.0)
MCV: 83.7 fL (ref 80.0–100.0)
Platelets: 206 10*3/uL (ref 150–400)
RBC: 4.97 MIL/uL (ref 3.87–5.11)
RDW: 14.4 % (ref 11.5–15.5)
WBC: 9.7 10*3/uL (ref 4.0–10.5)
nRBC: 0 % (ref 0.0–0.2)

## 2020-05-02 LAB — LIPASE, BLOOD: Lipase: 22 U/L (ref 11–51)

## 2020-05-02 LAB — FIBRIN DERIVATIVES D-DIMER (ARMC ONLY): Fibrin derivatives D-dimer (ARMC): 168.18 ng{FEU}/mL (ref 0.00–499.00)

## 2020-05-02 NOTE — ED Triage Notes (Signed)
Pt states coming into the ER for chest pain. Pt states pain started 2 weeks ago but has gotten worse. Pt states pain to both sides of the chest and has hand over left chest.

## 2020-05-02 NOTE — ED Provider Notes (Addendum)
[facility] Emergency Department Provider Note  Time seen: 9:45 PM  I have reviewed the triage vital signs and the nursing notes.   HISTORY  Chief Complaint Chest Pain   HPI Dawn Skinner is a 29 y.o. female with a past medical history of hypertension, anxiety, presents to the emergency department for chest pain.  According to the patient for the past 2 weeks she has been experiencing intermittent central chest pain.  States a history of chest pain in the past as well.  Patient does state the pain is somewhat worse with deep inspiration.  Denies any leg pain or swelling.  No history of DVTs previously.  Patient is on no estrogen or birth control.  Denies any shortness of breath cough or fever.  States occasional upper abdominal discomfort as well.  Patient states she was seen recently for the same with a negative work-up.  Past Medical History:  Diagnosis Date  . Frequent headaches   . Hypertension   . Iron deficiency anemia     Patient Active Problem List   Diagnosis Date Noted  . Anxiety 02/10/2020  . 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 Encounter to establish care with new doctor 01/27/2020  . Hyperglycemia 01/27/2020  . Scabies 01/27/2020  . Hypertension 11/06/2018  . Vertigo 11/06/2018    Past Surgical History:  Procedure Laterality Date  . NO PAST SURGERIES      Prior to Admission medications   Medication Sig Start Date End Date Taking? Authorizing Provider  Blood Pressure KIT Please allow patient to choose from what is covered by insurance Patient not taking: No sig reported 01/27/20   Malfi, Lupita Raider, FNP  FLUoxetine (PROZAC) 10 MG tablet Take 1 tablet (10 mg total) by mouth daily. 04/27/20   Malfi, Lupita Raider, FNP  hydrOXYzine (ATARAX/VISTARIL) 10 MG tablet Take 1 tablet (10 mg total) by mouth 3 (three) times daily as needed. 02/10/20   Malfi, Lupita Raider, FNP  ibuprofen (ADVIL) 600 MG tablet Take 1 tablet (600 mg total) by mouth every 6  (six) hours as needed. Patient not taking: Reported on 04/27/2020 03/31/20   Arta Silence, MD  Multiple Vitamins-Minerals (MULTIVITAMIN WITH MINERALS) tablet Take 1 tablet by mouth daily. Patient not taking: Reported on 04/27/2020 11/25/19   Caren Macadam, MD  permethrin (ELIMITE) 5 % cream Apply topically once. Patient not taking: Reported on 04/27/2020 01/27/20   [provider]  traZODone (DESYREL) 50 MG tablet Take 0.5-1 tablets (25-50 mg total) by mouth at bedtime as needed for sleep. 02/10/20   Malfi, Lupita Raider, FNP    No Known Allergies  Family History  Problem Relation Age of Onset  . Hypertension Mother   . Stroke Mother   . Diabetes Mellitus II Mother   . Hypertension Father   . Diabetes Father   . Colon polyps Maternal Grandfather   . Diabetes Paternal Grandmother     Social History Social History   Tobacco Use  . Smoking status: Never Smoker  . Smokeless tobacco: Never Used  Vaping Use  . Vaping Use: Never used  Substance Use Topics  . Alcohol use: Yes    Alcohol/week: 3.0 standard drinks    Types: 3 Glasses of wine per week    Comment: 2-3 weekly   . Drug use: No    Review of Systems Constitutional: Negative for fever. Cardiovascular: Central left-sided chest pain, mild to moderate sharp pain worse with inspiration. Respiratory: Negative for shortness of breath.  Negative for cough  Gastrointestinal: Negative for abdominal pain, vomiting Musculoskeletal: Negative for musculoskeletal complaints. No leg pain or swelling. Neurological: Negative for headache All other ROS negative  ____________________________________________   PHYSICAL EXAM  VITAL SIGNS: ED Triage Vitals  Enc Vitals Group     BP 05/02/20 1911 (!) 165/129     Pulse Rate 05/02/20 1911 97     Resp 05/02/20 1911 20     Temp 05/02/20 1911 97.8 F (36.6 C)     Temp Source 05/02/20 1911 Oral     SpO2 05/02/20 1911 100 %     Weight 05/02/20 1908 203 lb (92.1 kg)      Height 05/02/20 1908 $RemoveBefor'5\' 6"'[id number]$  (1.676 m)     Head Circumference --      Peak Flow --      Pain Score 05/02/20 1907 6     Pain Loc --      Pain Edu? --      Excl. in [place]? --    Constitutional: Alert and oriented. Well appearing and in no distress. Eyes: Normal exam ENT      Head: Normocephalic and atraumatic.      Mouth/Throat: Mucous membranes are moist. Cardiovascular: Normal rate, regular rhythm. No murmur Respiratory: Normal respiratory effort without tachypnea nor retractions. Breath sounds are clear.  Mild left-sided chest tenderness to palpation. Gastrointestinal: Soft and nontender. No distention. Musculoskeletal: Nontender with normal range of motion in all extremities. No lower extremity tenderness or edema. Neurologic:  Normal speech and language. No gross focal neurologic deficits  Skin:  Skin is warm, dry and intact.  Psychiatric: Mood and affect are normal.   ____________________________________________    EKG  EKG viewed and interpreted by myself shows normal sinus rhythm at 95 bpm with a narrow QRS, normal axis, normal intervals, no concerning ST changes.  ____________________________________________    RADIOLOGY  Chest x-ray is negative.  ____________________________________________   INITIAL IMPRESSION / ASSESSMENT AND PLAN / ED COURSE  Pertinent labs & imaging results that were available during my care of the patient were reviewed by me and considered in my medical decision making (see chart for details).   Patient presents to the emergency department for central left-sided chest pain intermittent over the past 2 weeks described as somewhat sharp.  States it will come and go but is sometimes worse with deep inspiration.  No cough fever shortness of breath or other concerning symptoms.  No leg pain swelling tenderness or estrogen use.  However given her pleuritic type pain although very low suspicion we will add on a D-dimer as precaution.  Patient's lab work  including cardiac enzymes negative.  Given her complaint of intermittent upper abdominal pain I have added on a lipase and hepatic function panel as well.  Benign abdominal exam.  Chest wall is mildly tender to palpation in the left chest.  Chest x-ray is negative.  Repeat troponin is negative.  D-dimer is pending.    D-dimer is negative.  LFT and lipase are pending.  Patient care signed out to oncoming provider.  Zuriyah Shatz was evaluated in Emergency Department on 05/02/2020 for the symptoms described in the history of present illness. She was evaluated in the context of the global COVID-19 pandemic, which necessitated consideration that the patient might be at risk for infection with the SARS-CoV-2 virus that causes COVID-19. Institutional protocols and algorithms that pertain to the evaluation of patients at risk for COVID-19 are in a state of rapid change based on information released by regulatory bodies  including the CDC and federal and state organizations. These policies and algorithms were followed during the patient's care in the ED.  ____________________________________________   FINAL CLINICAL IMPRESSION(S) / ED DIAGNOSES  Chest pain   Harvest Dark, MD 05/02/20 2241    Harvest Dark, MD 05/02/20 2259

## 2020-05-02 NOTE — ED Provider Notes (Signed)
Accepted care of this patient from Dr. Lenard Lance pending LFTs and lipase testing with plan to dc home if that was WNL. Labs returned normal. Patient reassessed by me with no abdominal tenderness. She is complaining of pleuritic CP. D-dimer, EKG, HS-trop, CXR all WNL. Possibly pleurisy per Dr. Awanda Mink work up. Will dc home with plan left by him.    Don Perking, [place], MD 05/02/20 [phone]

## 2020-05-02 NOTE — ED Notes (Signed)
EKG completed and give to provider for review

## 2020-05-03 ENCOUNTER — Telehealth: Payer: Self-pay | Admitting: *Deleted

## 2020-05-03 NOTE — Telephone Encounter (Signed)
Transition Care Management Unsuccessful Follow-up Telephone Call  Date of discharge and from where:  05/03/19 [facility] ED  Attempts:  1st Attempt  Reason for unsuccessful TCM follow-up call:  Left voice message

## 2020-05-04 NOTE — Telephone Encounter (Signed)
Transition Care Management Follow-up Telephone Call  Date of discharge and from where: 05/02/2020 from [facility]  How have you been since you were released from the hospital? Patient stated that she feels.   Any questions or concerns? No  Items Reviewed:  Did the pt receive and understand the discharge instructions provided? Yes   Medications obtained and verified? Yes   Other? No   Any new allergies since your discharge? No   Dietary orders reviewed? Yes  Do you have support at home? Yes   Functional Questionnaire: (I = Independent and D = Dependent) ADLs: I  Bathing/Dressing- I  Meal Prep- I  Eating- I  Maintaining continence- I  Transferring/Ambulation- I  Managing Meds- I  Follow up appointments reviewed:   PCP Hospital f/u appt confirmed? No  Pt will call to schedule.   Are transportation arrangements needed? No   If their condition worsens, is the pt aware to call PCP or go to the Emergency Dept.? Yes  Was the patient provided with contact information for the PCP's office or ED? Yes  Was to pt encouraged to call back with questions or concerns? Yes

## 2020-05-05 ENCOUNTER — Other Ambulatory Visit: Payer: Self-pay

## 2020-05-05 ENCOUNTER — Encounter: Payer: Self-pay | Admitting: Family Medicine

## 2020-05-05 ENCOUNTER — Ambulatory Visit (INDEPENDENT_AMBULATORY_CARE_PROVIDER_SITE_OTHER): Payer: Medicaid Other | Admitting: Family Medicine

## 2020-05-05 VITALS — BP 149/100 | HR 72 | Temp 97.1°F | Resp 17 | Ht 66.0 in | Wt 198.6 lb

## 2020-05-05 DIAGNOSIS — I10 Essential (primary) hypertension: Secondary | ICD-10-CM | POA: Diagnosis not present

## 2020-05-05 DIAGNOSIS — N760 Acute vaginitis: Secondary | ICD-10-CM | POA: Diagnosis not present

## 2020-05-05 DIAGNOSIS — B9689 Other specified bacterial agents as the cause of diseases classified elsewhere: Secondary | ICD-10-CM | POA: Diagnosis not present

## 2020-05-05 MED ORDER — HYDROCHLOROTHIAZIDE 12.5 MG PO TABS: 12.5000 mg | ORAL_TABLET | Freq: Every day | ORAL | 3 refills | Status: DC

## 2020-05-05 MED ORDER — METRONIDAZOLE 500 MG PO TABS: 500.0000 mg | ORAL_TABLET | Freq: Two times a day (BID) | ORAL | 0 refills | Status: AC

## 2020-05-05 NOTE — Progress Notes (Signed)
Subjective:    Patient ID: Dawn Skinner, female    DOB: 1991/03/06, 29 y.o.   MRN: [id number]  Dawn Skinner is a 29 y.o. female presenting on 05/05/2020 for Hospitalization Follow-up (Pt was seen in the ER on 05/02/20 for persistent chest pain x several weeks. Negative Cardiac work up. She complains of double blurry vision, persistent chest pain, neck pain, abdominal pain and bilateral arm pains. /) and Vaginal Discharge (Pt complains of vaginal odor w/ burgundy discharge x 2 weeks )   HPI  Dawn Skinner presents to clinic for follow up from the ER where she was seen for chest discomfort.  Reports had a negative cardiac work up, has been having blurry vision, persistent chest pain, neck pain, abdominal pain and bilateral arm pain.  Has concerns for vaginal discharge with odor.  Reports history of bacterial vaginosis and has concerns she may have this today.  Denies any concerns for any STI.  Depression screen [facility] 2/9 04/27/2020 01/27/2020  Decreased Interest 3 0  Down, Depressed, Hopeless 2 0  PHQ - 2 Score 5 0  Altered sleeping 3 -  Tired, decreased energy 3 -  Change in appetite 3 -  Feeling bad or failure about yourself  3 -  Trouble concentrating 3 -  Moving slowly or fidgety/restless 3 -  Suicidal thoughts 0 -  PHQ-9 Score 23 -    Social History   Tobacco Use  . Smoking status: Never Smoker  . Smokeless tobacco: Never Used  Vaping Use  . Vaping Use: Never used  Substance Use Topics  . Alcohol use: Not Currently    Alcohol/week: 3.0 standard drinks    Types: 3 Glasses of wine per week    Comment: 2-3 weekly   . Drug use: No    Review of Systems  Constitutional: Negative.   HENT: Negative.   Eyes: Positive for visual disturbance. Negative for photophobia, pain, discharge, redness and itching.  Respiratory: Negative.   Cardiovascular: Positive for chest pain. Negative for palpitations and leg swelling.  Gastrointestinal: Negative.   Endocrine: Negative.    Genitourinary: Positive for vaginal discharge. Negative for decreased urine volume, difficulty urinating, dyspareunia, dysuria, enuresis, flank pain, frequency, genital sores, hematuria, menstrual problem, pelvic pain, urgency, vaginal bleeding and vaginal pain.  Musculoskeletal: Positive for myalgias. Negative for arthralgias, back pain, gait problem, joint swelling, neck pain and neck stiffness.  Skin: Negative.   Allergic/Immunologic: Negative.   Neurological: Negative.   Hematological: Negative.   Psychiatric/Behavioral: Negative.    Per HPI unless specifically indicated above     Objective:    BP (!) 149/100 (BP Location: Right Arm, Patient Position: Sitting, Cuff Size: Normal)   Pulse 72   Temp (!) 97.1 F (36.2 C) (Temporal)   Resp 17   Ht 5\' 6"  (1.676 m)   Wt 198 lb 9.6 oz (90.1 kg)   SpO2 100%   BMI 32.05 kg/m   Wt Readings from Last 3 Encounters:  05/05/20 198 lb 9.6 oz (90.1 kg)  05/02/20 203 lb (92.1 kg)  04/27/20 203 lb 9.6 oz (92.4 kg)    Physical Exam Vitals and nursing note reviewed.  Constitutional:      General: She is not in acute distress.    Appearance: Normal appearance. She is well-developed and well-groomed. She is obese. She is not ill-appearing or toxic-appearing.  HENT:     Head: Normocephalic and atraumatic.     Nose:     Comments: 04/29/20 is in place, covering mouth  and nose. Eyes:     General: Lids are normal. Vision grossly intact.        Right eye: No discharge.        Left eye: No discharge.     Extraocular Movements: Extraocular movements intact.     Conjunctiva/sclera: Conjunctivae normal.     Pupils: Pupils are equal, round, and reactive to light.  Cardiovascular:     Rate and Rhythm: Normal rate and regular rhythm.     Pulses: Normal pulses.     Heart sounds: Normal heart sounds. No murmur heard. No friction rub. No gallop.   Pulmonary:     Effort: Pulmonary effort is normal. No respiratory distress.     Breath sounds: Normal  breath sounds.  Genitourinary:    Comments: Declined Skin:    General: Skin is warm and dry.     Capillary Refill: Capillary refill takes less than 2 seconds.  Neurological:     General: No focal deficit present.     Mental Status: She is alert and oriented to person, place, and time.  Psychiatric:        Attention and Perception: Attention and perception normal.        Mood and Affect: Mood and affect normal.        Speech: Speech normal.        Behavior: Behavior normal. Behavior is cooperative.        Thought Content: Thought content normal.        Cognition and Memory: Cognition and memory normal.        Judgment: Judgment normal.    Results for orders placed or performed during the hospital encounter of 05/02/20  Basic metabolic panel  Result Value Ref Range   Sodium 138 135 - 145 mmol/L   Potassium 3.7 3.5 - 5.1 mmol/L   Chloride 106 98 - 111 mmol/L   CO2 23 22 - 32 mmol/L   Glucose, Bld 90 70 - 99 mg/dL   BUN 12 6 - 20 mg/dL   Creatinine, Ser 9.74 0.44 - 1.00 mg/dL   Calcium 8.9 8.9 - 16.3 mg/dL   GFR, Estimated >84 >53 mL/min   Anion gap 9 5 - 15  CBC  Result Value Ref Range   WBC 9.7 4.0 - 10.5 K/uL   RBC 4.97 3.87 - 5.11 MIL/uL   Hemoglobin 13.5 12.0 - 15.0 g/dL   HCT 64.6 80.3 - 21.2 %   MCV 83.7 80.0 - 100.0 fL   MCH 27.2 26.0 - 34.0 pg   MCHC 32.5 30.0 - 36.0 g/dL   RDW 24.8 25.0 - 03.7 %   Platelets 206 150 - 400 K/uL   nRBC 0.0 0.0 - 0.2 %  Fibrin derivatives D-Dimer ([facility] only)  Result Value Ref Range   Fibrin derivatives D-dimer ([facility]) 168.18 0.00 - 499.00 ng/mL (FEU)  Hepatic function panel  Result Value Ref Range   Total Protein 7.1 6.5 - 8.1 g/dL   Albumin 4.0 3.5 - 5.0 g/dL   AST 21 15 - 41 U/L   ALT 14 0 - 44 U/L   Alkaline Phosphatase 58 38 - 126 U/L   Total Bilirubin 0.7 0.3 - 1.2 mg/dL   Bilirubin, Direct <0.4 0.0 - 0.2 mg/dL   Indirect Bilirubin NOT CALCULATED 0.3 - 0.9 mg/dL  Lipase, blood  Result Value Ref Range   Lipase 22 11 -  51 U/L  Troponin I (High Sensitivity)  Result Value Ref Range   Troponin I (High  Sensitivity) 3 <18 ng/L  Troponin I (High Sensitivity)  Result Value Ref Range   Troponin I (High Sensitivity) 3 <18 ng/L      Assessment & Plan:   Problem List Items Addressed This Visit      Cardiovascular and Mediastinum   Hypertension - Primary    Uncontrolled hypertension.  BP is not at goal < 130/80.  Pt reports working on lifestyle modifications.  Taking medications tolerating well without side effects.  Complications:  Overweight, unknown other complications  Plan: 1. START hydrochlorothiazide 12.5mg  daily 2. Obtain labs at next visit  3. Encouraged heart healthy diet and increasing exercise to 30 minutes most days of the week, going no more than 2 days in a row without exercise. 4. Check BP 1-2 x per week at home, keep log, and bring to clinic at next appointment. 5. Follow up 2 weeks.       Relevant Medications   hydrochlorothiazide (HYDRODIURIL) 12.5 MG tablet     Genitourinary   Bacterial vaginosis    Likely BV based on symptoms reported.  Declined vaginal swab for testing.  Will treat with metronidazole 500mg  BID x 7 days.  To RTC PRN      Relevant Medications   metroNIDAZOLE (FLAGYL) 500 MG tablet      Meds ordered this encounter  Medications  . DISCONTD: hydrochlorothiazide (HYDRODIURIL) 12.5 MG tablet    Sig: Take 1 tablet (12.5 mg total) by mouth daily.    Dispense:  90 tablet    Refill:  3  . metroNIDAZOLE (FLAGYL) 500 MG tablet    Sig: Take 1 tablet (500 mg total) by mouth 2 (two) times daily for 7 days.    Dispense:  14 tablet    Refill:  0  . hydrochlorothiazide (HYDRODIURIL) 12.5 MG tablet    Sig: Take 1 tablet (12.5 mg total) by mouth daily.    Dispense:  90 tablet    Refill:  3   Follow up plan: Return in about 2 weeks (around 05/19/2020) for HTN F/U.   07/17/2020, FNP Family Nurse Practitioner [facility] 05/05/2020, 4:20 PM

## 2020-05-05 NOTE — Patient Instructions (Signed)
I have sent in a prescription for hydrochlorothiazide 12.5mg  to take 1 tablet daily for your hypertension  Try to get exercise a minimum of 30 minutes per day at least 5 days per week as well as  adequate water intake all while measuring blood pressure a few times per week.  Keep a blood pressure log and bring back to clinic at your next visit.  If your readings are consistently over 130/80 to contact our office/send me a MyChart message and we will see you sooner.  Can try DASH and Mediterranean diet options, avoiding processed foods, lowering sodium intake, avoiding pork products, and eating a plant based diet for optimal health.  We will plan to see you back in 2 weeks for hypertension follow up  You will receive a survey after today's visit either digitally by e-mail or paper by USPS mail. Your experiences and feedback matter to [place].  Please respond so we know how we are doing as we provide care for you.  Call us with any questions/concerns/needs.  It is my goal to be available to you for your health concerns.  Thanks for choosing me to be a partner in your healthcare needs!  Charlaine Dalton, FNP-C Family Nurse Practitioner [facility] Phone: [phone]

## 2020-05-06 DIAGNOSIS — N760 Acute vaginitis: Secondary | ICD-10-CM | POA: Insufficient documentation

## 2020-05-06 DIAGNOSIS — B9689 Other specified bacterial agents as the cause of diseases classified elsewhere: Secondary | ICD-10-CM | POA: Insufficient documentation

## 2020-05-06 NOTE — Assessment & Plan Note (Addendum)
Likely BV based on symptoms reported.  Declined vaginal swab for testing.  Will treat with metronidazole 500mg  BID x 7 days.  To RTC PRN

## 2020-05-06 NOTE — Assessment & Plan Note (Signed)
Uncontrolled hypertension.  BP is not at goal < 130/80.  Pt reports working on lifestyle modifications.  Taking medications tolerating well without side effects.  Complications:  Overweight, unknown other complications  Plan: 1. START hydrochlorothiazide 12.5mg  daily 2. Obtain labs at next visit  3. Encouraged heart healthy diet and increasing exercise to 30 minutes most days of the week, going no more than 2 days in a row without exercise. 4. Check BP 1-2 x per week at home, keep log, and bring to clinic at next appointment. 5. Follow up 2 weeks.

## 2020-05-12 ENCOUNTER — Ambulatory Visit: Payer: Medicaid Other | Admitting: Family Medicine

## 2020-05-19 ENCOUNTER — Encounter: Payer: Self-pay | Admitting: Family Medicine

## 2020-05-19 ENCOUNTER — Ambulatory Visit (INDEPENDENT_AMBULATORY_CARE_PROVIDER_SITE_OTHER): Payer: Medicaid Other | Admitting: Family Medicine

## 2020-05-19 ENCOUNTER — Other Ambulatory Visit: Payer: Self-pay

## 2020-05-19 VITALS — BP 124/86 | HR 103 | Temp 97.3°F | Resp 18 | Ht 66.0 in | Wt 197.6 lb

## 2020-05-19 DIAGNOSIS — I10 Essential (primary) hypertension: Secondary | ICD-10-CM

## 2020-05-19 MED ORDER — HYDROCHLOROTHIAZIDE 25 MG PO TABS: 25.0000 mg | ORAL_TABLET | Freq: Every day | ORAL | 1 refills | Status: DC

## 2020-05-19 NOTE — Progress Notes (Signed)
Subjective:    Patient ID: Dawn Skinner, female    DOB: September 12, 1990, 30 y.o.   MRN: [id number]  Dawn Skinner is a 30 y.o. female presenting on 05/19/2020 for Hypertension   HPI  [name] presents to clinic for a follow up on her hypertension.  Hypertension - She is not checking BP at home or outside of clinic.    - Current medications: hydrochlorothiazide 12.5mg  daily, tolerating well without side effects - She is not currently symptomatic. - Pt denies headache, lightheadedness, dizziness, changes in vision, chest tightness/pressure, palpitations, leg swelling, sudden loss of speech or loss of consciousness. - She  reports no regular exercise routine. - Her diet is high in salt, high in fat, and high in carbohydrates.  Depression screen [facility] 2/9 04/27/2020 01/27/2020  Decreased Interest 3 0  Down, Depressed, Hopeless 2 0  PHQ - 2 Score 5 0  Altered sleeping 3 -  Tired, decreased energy 3 -  Change in appetite 3 -  Feeling bad or failure about yourself  3 -  Trouble concentrating 3 -  Moving slowly or fidgety/restless 3 -  Suicidal thoughts 0 -  PHQ-9 Score 23 -    Social History   Tobacco Use  . Smoking status: Never Smoker  . Smokeless tobacco: Never Used  Vaping Use  . Vaping Use: Never used  Substance Use Topics  . Alcohol use: Not Currently    Alcohol/week: 3.0 standard drinks    Types: 3 Glasses of wine per week    Comment: 2-3 weekly   . Drug use: No    Review of Systems  Constitutional: Negative.   HENT: Negative.   Eyes: Negative.   Respiratory: Negative.   Cardiovascular: Negative.   Gastrointestinal: Negative.   Endocrine: Negative.   Genitourinary: Negative.   Musculoskeletal: Negative.   Skin: Negative.   Allergic/Immunologic: Negative.   Neurological: Negative.   Hematological: Negative.   Psychiatric/Behavioral: Negative.    Per HPI unless specifically indicated above     Objective:    BP 124/86 (BP Location: Right Arm, Patient  Position: Sitting, Cuff Size: Normal)   Pulse (!) 103   Temp (!) 97.3 F (36.3 C) (Temporal)   Resp 18   Ht 5\' 6"  (1.676 m)   Wt 197 lb 9.6 oz (89.6 kg)   SpO2 100%   BMI 31.89 kg/m   Wt Readings from Last 3 Encounters:  05/19/20 197 lb 9.6 oz (89.6 kg)  05/05/20 198 lb 9.6 oz (90.1 kg)  05/02/20 203 lb (92.1 kg)    Physical Exam Vitals and nursing note reviewed.  Constitutional:      General: She is not in acute distress.    Appearance: Normal appearance. She is well-developed and well-groomed. She is obese. She is not ill-appearing or toxic-appearing.  HENT:     Head: Normocephalic and atraumatic.     Nose:     Comments: 05/04/20 is in place, covering mouth and nose. Eyes:     General: Lids are normal. Vision grossly intact.        Right eye: No discharge.        Left eye: No discharge.     Extraocular Movements: Extraocular movements intact.     Conjunctiva/sclera: Conjunctivae normal.     Pupils: Pupils are equal, round, and reactive to light.  Cardiovascular:     Rate and Rhythm: Normal rate and regular rhythm.     Pulses: Normal pulses.     Heart sounds: Normal heart sounds.  No murmur heard. No friction rub. No gallop.   Pulmonary:     Effort: Pulmonary effort is normal. No respiratory distress.     Breath sounds: Normal breath sounds.  Skin:    General: Skin is warm and dry.     Capillary Refill: Capillary refill takes less than 2 seconds.  Neurological:     General: No focal deficit present.     Mental Status: She is alert and oriented to person, place, and time.  Psychiatric:        Attention and Perception: Attention and perception normal.        Mood and Affect: Mood and affect normal.        Speech: Speech normal.        Behavior: Behavior normal. Behavior is cooperative.        Thought Content: Thought content normal.        Cognition and Memory: Cognition and memory normal.        Judgment: Judgment normal.    Results for orders placed or performed  during the hospital encounter of 21/19/41  Basic metabolic panel  Result Value Ref Range   Sodium 138 135 - 145 mmol/L   Potassium 3.7 3.5 - 5.1 mmol/L   Chloride 106 98 - 111 mmol/L   CO2 23 22 - 32 mmol/L   Glucose, Bld 90 70 - 99 mg/dL   BUN 12 6 - 20 mg/dL   Creatinine, Ser 0.96 0.44 - 1.00 mg/dL   Calcium 8.9 8.9 - 10.3 mg/dL   GFR, Estimated >60 >60 mL/min   Anion gap 9 5 - 15  CBC  Result Value Ref Range   WBC 9.7 4.0 - 10.5 K/uL   RBC 4.97 3.87 - 5.11 MIL/uL   Hemoglobin 13.5 12.0 - 15.0 g/dL   HCT 41.6 36.0 - 46.0 %   MCV 83.7 80.0 - 100.0 fL   MCH 27.2 26.0 - 34.0 pg   MCHC 32.5 30.0 - 36.0 g/dL   RDW 14.4 11.5 - 15.5 %   Platelets 206 150 - 400 K/uL   nRBC 0.0 0.0 - 0.2 %  Fibrin derivatives D-Dimer ([facility] only)  Result Value Ref Range   Fibrin derivatives D-dimer ([facility]) 168.18 0.00 - 499.00 ng/mL (FEU)  Hepatic function panel  Result Value Ref Range   Total Protein 7.1 6.5 - 8.1 g/dL   Albumin 4.0 3.5 - 5.0 g/dL   AST 21 15 - 41 U/L   ALT 14 0 - 44 U/L   Alkaline Phosphatase 58 38 - 126 U/L   Total Bilirubin 0.7 0.3 - 1.2 mg/dL   Bilirubin, Direct <0.1 0.0 - 0.2 mg/dL   Indirect Bilirubin NOT CALCULATED 0.3 - 0.9 mg/dL  Lipase, blood  Result Value Ref Range   Lipase 22 11 - 51 U/L  Troponin I (High Sensitivity)  Result Value Ref Range   Troponin I (High Sensitivity) 3 <18 ng/L  Troponin I (High Sensitivity)  Result Value Ref Range   Troponin I (High Sensitivity) 3 <18 ng/L      Assessment & Plan:   Problem List Items Addressed This Visit      Cardiovascular and Mediastinum   Hypertension - Primary    Uncontrolled hypertension.  BP is not at goal < 130/80.  Pt is working on lifestyle modifications.  Taking medications tolerating well without side effects.  Complications:  Obesity  Plan: 1. Increase hydrochlorothiazide 12.5mg  to 25mg  daily 2. Obtain labs at next visit to evaluate electrolytes  3. Encouraged heart healthy diet and increasing  exercise to 30 minutes most days of the week, going no more than 2 days in a row without exercise. 4. Check BP 1-2 x per week at home, keep log, and bring to clinic at next appointment. 5. Follow up 2 weeks.       Relevant Medications   hydrochlorothiazide (HYDRODIURIL) 25 MG tablet      Meds ordered this encounter  Medications  . hydrochlorothiazide (HYDRODIURIL) 25 MG tablet    Sig: Take 1 tablet (25 mg total) by mouth daily.    Dispense:  90 tablet    Refill:  1   Follow up plan: Return in about 2 weeks (around 06/02/2020) for HTN F/U.   Charlaine Dalton, FNP Family Nurse Practitioner [facility] 05/19/2020, 8:50 AM

## 2020-05-19 NOTE — Patient Instructions (Signed)
I have sent in a prescription for hydrochlorothiazide 25mg  to take daily.  If you have the 12.5mg  tablets for hydrochlorothiazide at home, you can take two of these until you run out of this prescription.  We will plan to see you back in clinic in 2 weeks for a re-evaluation of your blood pressure and have labs drawn for evaluation of your electrolytes.

## 2020-05-19 NOTE — Assessment & Plan Note (Signed)
Uncontrolled hypertension.  BP is not at goal < 130/80.  Pt is working on lifestyle modifications.  Taking medications tolerating well without side effects.  Complications:  Obesity  Plan: 1. Increase hydrochlorothiazide 12.5mg  to 25mg  daily 2. Obtain labs at next visit to evaluate electrolytes  3. Encouraged heart healthy diet and increasing exercise to 30 minutes most days of the week, going no more than 2 days in a row without exercise. 4. Check BP 1-2 x per week at home, keep log, and bring to clinic at next appointment. 5. Follow up 2 weeks.

## 2020-05-31 ENCOUNTER — Ambulatory Visit: Payer: Medicaid Other | Admitting: Family Medicine

## 2020-06-02 ENCOUNTER — Telehealth (INDEPENDENT_AMBULATORY_CARE_PROVIDER_SITE_OTHER): Payer: Medicaid Other | Admitting: Family Medicine

## 2020-06-02 ENCOUNTER — Encounter: Payer: Self-pay | Admitting: Family Medicine

## 2020-06-02 ENCOUNTER — Other Ambulatory Visit: Payer: Self-pay

## 2020-06-02 VITALS — BP 134/79 | HR 73 | Temp 98.4°F | Resp 18 | Ht 66.0 in | Wt 200.6 lb

## 2020-06-02 DIAGNOSIS — I10 Essential (primary) hypertension: Secondary | ICD-10-CM | POA: Diagnosis not present

## 2020-06-02 DIAGNOSIS — R079 Chest pain, unspecified: Secondary | ICD-10-CM | POA: Diagnosis not present

## 2020-06-02 NOTE — Patient Instructions (Signed)
Have your labs drawn for electrolyte evaluation since increasing your hydrochlorothiazide to 25mg  daily.  A referral to Cardiology has been placed today.  If you have not heard from the specialty office or our referral coordinator within 1 week, please let know and we will follow up with the referral coordinator for an update.  Try to get exercise a minimum of 30 minutes per day at least 5 days per week as well as  adequate water intake all while measuring blood pressure a few times per week.  Keep a blood pressure log and bring back to clinic at your next visit.  If your readings are consistently over 130/80 to contact our office/send me a MyChart message and we will see you sooner.  Can try DASH and Mediterranean diet options, avoiding processed foods, lowering sodium intake, avoiding pork products, and eating a plant based diet for optimal health.  We will plan to see you back in 6 months for hypertension follow up visit  You will receive a survey after today's visit either digitally by e-mail or paper by USPS mail. Your experiences and feedback matter to [place].  Please respond so we know how we are doing as we provide care for you.  Call us with any questions/concerns/needs.  It is my goal to be available to you for your health concerns.  Thanks for choosing me to be a partner in your healthcare needs!  [place], FNP-C Family Nurse Practitioner [facility] Phone: [phone]

## 2020-06-02 NOTE — Assessment & Plan Note (Signed)
Controlled hypertension.  BP is at goal.  Pt reports working on lifestyle modifications.  Taking medications tolerating well without side effects. Has continued concerns for intermittent chest pain.  Will refer to cardiology for evaluation. Complications: Obesity  Plan: 1. Continue taking hydrochlorothiazide 25mg  daily 2. Obtain labs today  3. Encouraged heart healthy diet and increasing exercise to 30 minutes most days of the week, going no more than 2 days in a row without exercise. 4. Check BP 1-2 x per week at home, keep log, and bring to clinic at next appointment. 5. Follow up 6 months.  6. Referral to cardiology placed.

## 2020-06-02 NOTE — Progress Notes (Signed)
Subjective:    Patient ID: Dawn Skinner, female    DOB: [birth year], 30 y.o.   MRN: [id number]  Dawn Skinner is a 30 y.o. female presenting on 06/02/2020 for Hypertension   HPI  Ms. Aronoff presents to clinic for hypertension follow up.  She has concerns for continued intermittent chest pain.  Denies numbness, tingling, weakness, palpitations, SOB, leg swelling.  Hypertension - She is not checking BP at home or outside of clinic.    - Current medications: hydrochlorothiazide 25mg  daily, tolerating well without side effects - She is not currently symptomatic. - Pt denies headache, lightheadedness, dizziness, changes in vision, chest tightness/pressure, palpitations, leg swelling, sudden loss of speech or loss of consciousness. - She  reports no regular exercise routine. - Her diet is high in salt, high in fat, and high in carbohydrates.  Depression screen [facility] 2/9 04/27/2020 01/27/2020  Decreased Interest 3 0  Down, Depressed, Hopeless 2 0  PHQ - 2 Score 5 0  Altered sleeping 3 -  Tired, decreased energy 3 -  Change in appetite 3 -  Feeling bad or failure about yourself  3 -  Trouble concentrating 3 -  Moving slowly or fidgety/restless 3 -  Suicidal thoughts 0 -  PHQ-9 Score 23 -    Social History   Tobacco Use  . Smoking status: Never Smoker  . Smokeless tobacco: Never Used  Vaping Use  . Vaping Use: Never used  Substance Use Topics  . Alcohol use: Not Currently    Alcohol/week: 3.0 standard drinks    Types: 3 Glasses of wine per week    Comment: 2-3 weekly   . Drug use: No    Review of Systems  Constitutional: Negative.   HENT: Negative.   Eyes: Negative.   Respiratory: Negative.   Cardiovascular: Positive for chest pain. Negative for palpitations and leg swelling.  Gastrointestinal: Negative.   Endocrine: Negative.   Genitourinary: Negative.   Musculoskeletal: Negative.   Skin: Negative.   Allergic/Immunologic: Negative.   Neurological: Negative.    Hematological: Negative.   Psychiatric/Behavioral: Negative.    Per HPI unless specifically indicated above     Objective:    BP 134/79 (BP Location: Left Arm, Patient Position: Sitting, Cuff Size: Normal)   Pulse 73   Temp 98.4 F (36.9 C) (Oral)   Resp 18   Ht 5\' 6"  (1.676 m)   Wt 200 lb 9.6 oz (91 kg)   SpO2 100%   BMI 32.38 kg/m   Wt Readings from Last 3 Encounters:  06/02/20 200 lb 9.6 oz (91 kg)  05/19/20 197 lb 9.6 oz (89.6 kg)  05/05/20 198 lb 9.6 oz (90.1 kg)    Physical Exam Vitals and nursing note reviewed.  Constitutional:      General: She is not in acute distress.    Appearance: Normal appearance. She is well-developed and well-groomed. She is obese. She is not ill-appearing or toxic-appearing.  HENT:     Head: Normocephalic and atraumatic.     Nose:     Comments: 07/17/20 is in place, covering mouth and nose. Eyes:     General: Lids are normal. Vision grossly intact.        Right eye: No discharge.        Left eye: No discharge.     Extraocular Movements: Extraocular movements intact.     Conjunctiva/sclera: Conjunctivae normal.     Pupils: Pupils are equal, round, and reactive to light.  Cardiovascular:     Rate  and Rhythm: Normal rate and regular rhythm.     Pulses: Normal pulses.     Heart sounds: Normal heart sounds. No murmur heard. No friction rub. No gallop.   Pulmonary:     Effort: Pulmonary effort is normal. No respiratory distress.     Breath sounds: Normal breath sounds.  Musculoskeletal:     Right lower leg: No edema.     Left lower leg: No edema.  Skin:    General: Skin is warm and dry.     Capillary Refill: Capillary refill takes less than 2 seconds.  Neurological:     General: No focal deficit present.     Mental Status: She is alert and oriented to person, place, and time.  Psychiatric:        Attention and Perception: Attention and perception normal.        Mood and Affect: Mood and affect normal.        Speech: Speech  normal.        Behavior: Behavior normal. Behavior is cooperative.        Thought Content: Thought content normal.        Cognition and Memory: Cognition and memory normal.        Judgment: Judgment normal.    Results for orders placed or performed during the hospital encounter of 05/02/20  Basic metabolic panel  Result Value Ref Range   Sodium 138 135 - 145 mmol/L   Potassium 3.7 3.5 - 5.1 mmol/L   Chloride 106 98 - 111 mmol/L   CO2 23 22 - 32 mmol/L   Glucose, Bld 90 70 - 99 mg/dL   BUN 12 6 - 20 mg/dL   Creatinine, Ser 8.65 0.44 - 1.00 mg/dL   Calcium 8.9 8.9 - 78.4 mg/dL   GFR, Estimated >69 >62 mL/min   Anion gap 9 5 - 15  CBC  Result Value Ref Range   WBC 9.7 4.0 - 10.5 K/uL   RBC 4.97 3.87 - 5.11 MIL/uL   Hemoglobin 13.5 12.0 - 15.0 g/dL   HCT 95.2 84.1 - 32.4 %   MCV 83.7 80.0 - 100.0 fL   MCH 27.2 26.0 - 34.0 pg   MCHC 32.5 30.0 - 36.0 g/dL   RDW 40.1 02.7 - 25.3 %   Platelets 206 150 - 400 K/uL   nRBC 0.0 0.0 - 0.2 %  Fibrin derivatives D-Dimer ([facility] only)  Result Value Ref Range   Fibrin derivatives D-dimer ([facility]) 168.18 0.00 - 499.00 ng/mL (FEU)  Hepatic function panel  Result Value Ref Range   Total Protein 7.1 6.5 - 8.1 g/dL   Albumin 4.0 3.5 - 5.0 g/dL   AST 21 15 - 41 U/L   ALT 14 0 - 44 U/L   Alkaline Phosphatase 58 38 - 126 U/L   Total Bilirubin 0.7 0.3 - 1.2 mg/dL   Bilirubin, Direct <6.6 0.0 - 0.2 mg/dL   Indirect Bilirubin NOT CALCULATED 0.3 - 0.9 mg/dL  Lipase, blood  Result Value Ref Range   Lipase 22 11 - 51 U/L  Troponin I (High Sensitivity)  Result Value Ref Range   Troponin I (High Sensitivity) 3 <18 ng/L  Troponin I (High Sensitivity)  Result Value Ref Range   Troponin I (High Sensitivity) 3 <18 ng/L      Assessment & Plan:   Problem List Items Addressed This Visit      Cardiovascular and Mediastinum   Hypertension - Primary    Controlled hypertension.  BP  is at goal.  Pt reports working on lifestyle modifications.  Taking  medications tolerating well without side effects. Has continued concerns for intermittent chest pain.  Will refer to cardiology for evaluation. Complications: Obesity  Plan: 1. Continue taking hydrochlorothiazide 25mg  daily 2. Obtain labs today  3. Encouraged heart healthy diet and increasing exercise to 30 minutes most days of the week, going no more than 2 days in a row without exercise. 4. Check BP 1-2 x per week at home, keep log, and bring to clinic at next appointment. 5. Follow up 6 months.  6. Referral to cardiology placed.      Relevant Orders   COMPLETE METABOLIC PANEL WITH GFR   Ambulatory referral to Cardiology     Other   Chest pain    See HTN AP      Relevant Orders   Ambulatory referral to Cardiology      No orders of the defined types were placed in this encounter.  Follow up plan: Return in about 6 months (around 11/30/2020) for HTN F/U.   12/02/2020, FNP Family Nurse Practitioner [facility] 06/02/2020, 8:40 AM

## 2020-06-02 NOTE — Assessment & Plan Note (Signed)
See HTN AP

## 2020-06-03 ENCOUNTER — Encounter: Payer: Self-pay | Admitting: Cardiology

## 2020-06-03 ENCOUNTER — Ambulatory Visit (INDEPENDENT_AMBULATORY_CARE_PROVIDER_SITE_OTHER): Payer: Medicaid Other | Admitting: Cardiology

## 2020-06-03 ENCOUNTER — Ambulatory Visit: Payer: Medicaid Other | Admitting: Family Medicine

## 2020-06-03 VITALS — BP 120/90 | HR 63 | Ht 66.0 in | Wt 200.0 lb

## 2020-06-03 DIAGNOSIS — R072 Precordial pain: Secondary | ICD-10-CM

## 2020-06-03 DIAGNOSIS — I10 Essential (primary) hypertension: Secondary | ICD-10-CM

## 2020-06-03 LAB — COMPLETE METABOLIC PANEL WITH GFR
AG Ratio: 1.5 (calc) (ref 1.0–2.5)
ALT: 11 U/L (ref 6–29)
AST: 17 U/L (ref 10–30)
Albumin: 4.1 g/dL (ref 3.6–5.1)
Alkaline phosphatase (APISO): 65 U/L (ref 31–125)
BUN: 8 mg/dL (ref 7–25)
CO2: 29 mmol/L (ref 20–32)
Calcium: 9.4 mg/dL (ref 8.6–10.2)
Chloride: 105 mmol/L (ref 98–110)
Creat: 0.92 mg/dL (ref 0.50–1.10)
GFR, Est African American: 98 mL/min/{1.73_m2} (ref >=60)
GFR, Est Non African American: 84 mL/min/{1.73_m2} (ref >=60)
Globulin: 2.8 g/dL (calc) (ref 1.9–3.7)
Glucose, Bld: 88 mg/dL (ref 65–99)
Potassium: 4.1 mmol/L (ref 3.5–5.3)
Sodium: 140 mmol/L (ref 135–146)
Total Bilirubin: 0.3 mg/dL (ref 0.2–1.2)
Total Protein: 6.9 g/dL (ref 6.1–8.1)

## 2020-06-03 NOTE — Progress Notes (Signed)
Cardiology Office Note:    Date:  06/03/2020   ID:  Dawn Skinner, DOB Sep 28, 1990, MRN [id number]  PCP:  Verl Bangs, FNP  [facility] Cardiologist:  Kate Sable, MD  [place] Electrophysiologist:  None   Referring MD: Verl Bangs, FNP   Chief Complaint  Patient presents with  . NEW patient-Hypertension/chest pain    History of Present Illness:    Dawn Skinner is a 30 y.o. female with a hx of hypertension, anxiety who presents due to chest pain.  She states from history of chest pain over the past 2 months.  Her blood pressures were noted to be elevated with systolics up to 478G at onset.  Was started on HCTZ which she has taken over the past month.  Her symptoms have improved although still present with improvement in BP.  She characterizes symptoms as sharp, usually left-sided, lasting about 10 minutes, not related with exertion, sometimes associated with taking a deep breath.  Denies shortness of breath, family history of heart disease, palpitations.  Was seen on 05/02/2020 in the ED due to chest pain.  Symptoms appear to be pleuritic.  Work-up with EKG and troponins were unrevealing.  Past Medical History:  Diagnosis Date  . Frequent headaches   . Hypertension   . Iron deficiency anemia     Past Surgical History:  Procedure Laterality Date  . NO PAST SURGERIES      Current Medications: Current Meds  Medication Sig  . Blood Pressure KIT Please allow patient to choose from what is covered by insurance  . FLUoxetine (PROZAC) 10 MG tablet Take 1 tablet (10 mg total) by mouth daily.  . hydrochlorothiazide (HYDRODIURIL) 25 MG tablet Take 1 tablet (25 mg total) by mouth daily.  . hydrOXYzine (ATARAX/VISTARIL) 10 MG tablet Take 1 tablet (10 mg total) by mouth 3 (three) times daily as needed.  . traZODone (DESYREL) 50 MG tablet Take 0.5-1 tablets (25-50 mg total) by mouth at bedtime as needed for sleep.     Allergies:   Patient has no known allergies.    Social History   Socioeconomic History  . Marital status: Single    Spouse name: Not on file  . Number of children: Not on file  . Years of education: Not on file  . Highest education level: Not on file  Occupational History  . Not on file  Tobacco Use  . Smoking status: Never Smoker  . Smokeless tobacco: Never Used  Vaping Use  . Vaping Use: Never used  Substance and Sexual Activity  . Alcohol use: Yes    Alcohol/week: 3.0 standard drinks    Types: 3 Glasses of wine per week    Comment: 2-3 weekly   . Drug use: No  . Sexual activity: Yes    Partners: Male    Birth control/protection: Injection, Condom  Other Topics Concern  . Not on file  Social History Narrative  . Not on file   Social Determinants of Health   Financial Resource Strain: Not on file  Food Insecurity: Not on file  Transportation Needs: Not on file  Physical Activity: Not on file  Stress: Not on file  Social Connections: Not on file     Family History: The patient's family history includes Colon polyps in her maternal grandfather; Diabetes in her father and paternal grandmother; Diabetes Mellitus II in her mother; Hypertension in her father and mother; Stroke in her mother.  ROS:   Please see the history of present  illness.     All other systems reviewed and are negative.  EKGs/Labs/Other Studies Reviewed:    The following studies were reviewed today:   EKG:  EKG is  ordered today.  The ekg ordered today demonstrates normal sinus rhythm, normal ECG  Recent Labs: 02/02/2020: TSH 0.85 05/02/2020: Hemoglobin 13.5; Platelets 206 06/02/2020: ALT 11; BUN 8; Creat 0.92; Potassium 4.1; Sodium 140  Recent Lipid Panel    Component Value Date/Time   CHOL 160 02/02/2020 0909   TRIG 73 02/02/2020 0909   HDL 45 (L) 02/02/2020 0909   CHOLHDL 3.6 02/02/2020 0909   LDLCALC 99 02/02/2020 0909     Risk Assessment/Calculations:      Physical Exam:    VS:  BP 120/90 (BP Location: Right Arm,  Patient Position: Sitting, Cuff Size: Large)   Pulse 63   Ht _0  (1.676 m)   Wt 200 lb (90.7 kg)   SpO2 98%   BMI 32.28 kg/m     Wt Readings from Last 3 Encounters:  06/03/20 200 lb (90.7 kg)  06/02/20 200 lb 9.6 oz (91 kg)  05/19/20 197 lb 9.6 oz (89.6 kg)     GEN:  Well nourished, well developed in no acute distress HEENT: Normal NECK: No JVD; No carotid bruits LYMPHATICS: No lymphadenopathy CARDIAC: RRR, no murmurs, rubs, gallops RESPIRATORY:  Clear to auscultation without rales, wheezing or rhonchi  ABDOMEN: Soft, non-tender, non-distended MUSCULOSKELETAL:  No edema; No deformity  SKIN: Warm and dry NEUROLOGIC:  Alert and oriented x 3 PSYCHIATRIC:  Normal affect   ASSESSMENT:    1. Precordial pain   2. Primary hypertension    PLAN:    In order of problems listed above:  1. Chest pain, does not appear to be cardiac, pleuritic in nature..  Echo to evaluate pericardial effusion, overall cardiac function.  Trial of anti-inflammatories advised.  She is young, has no significant CAD risk factors apart from hypertension. 2. History of hypertension, BP controlled, continue HCTZ as prescribed.  Follow-up after echo     Medication Adjustments/Labs and Tests Ordered: Current medicines are reviewed at length with the patient today.  Concerns regarding medicines are outlined above.  Orders Placed This Encounter  Procedures  . EKG 12-Lead  . ECHOCARDIOGRAM COMPLETE   No orders of the defined types were placed in this encounter.   Patient Instructions  Medication Instructions:  No Changes  *If you need a refill on your cardiac medications before your next appointment, please call your pharmacy*   Lab Work: None Ordered If you have labs (blood work) drawn today and your tests are completely normal, you will receive your results only by: Marland Kitchen MyChart Message (if you have MyChart) OR . A paper copy in the mail If you have any lab test that is abnormal or we need to  change your treatment, we will call you to review the results.   Testing/Procedures:  1.  Your physician has requested that you have an echocardiogram. Echocardiography is a painless test that uses sound waves to create images of your heart. It provides your doctor with information about the size and shape of your heart and how well your heart's chambers and valves are working. This procedure takes approximately one hour. There are no restrictions for this procedure.    Follow-Up: At [facility], you and your health needs are our priority.  As part of our continuing mission to provide you with exceptional heart care, we have created designated Provider Care Teams.  These Care Teams include your primary Cardiologist (physician) and Advanced Practice Providers (APPs -  Physician Assistants and Nurse Practitioners) who all work together to provide you with the care you need, when you need it.  We recommend signing up for the patient portal called "MyChart".  Sign up information is provided on this After Visit Summary.  MyChart is used to connect with patients for Virtual Visits (Telemedicine).  Patients are able to view lab/test results, encounter notes, upcoming appointments, etc.  Non-urgent messages can be sent to your provider as well.   To learn more about what you can do with MyChart, go to [email or web address].    Your next appointment:   Follow up after Echo   The format for your next appointment:   In Person  Provider:   Kate Sable, MD   Other Instructions      Signed, Kate Sable, MD  06/03/2020 1:34 PM    [place]

## 2020-06-03 NOTE — Patient Instructions (Signed)
Medication Instructions:  No Changes  *If you need a refill on your cardiac medications before your next appointment, please call your pharmacy*   Lab Work: None Ordered If you have labs (blood work) drawn today and your tests are completely normal, you will receive your results only by: Marland Kitchen MyChart Message (if you have MyChart) OR . A paper copy in the mail If you have any lab test that is abnormal or we need to change your treatment, we will call you to review the results.   Testing/Procedures:  1.  Your physician has requested that you have an echocardiogram. Echocardiography is a painless test that uses sound waves to create images of your heart. It provides your doctor with information about the size and shape of your heart and how well your heart's chambers and valves are working. This procedure takes approximately one hour. There are no restrictions for this procedure.    Follow-Up: At [facility], you and your health needs are our priority.  As part of our continuing mission to provide you with exceptional heart care, we have created designated Provider Care Teams.  These Care Teams include your primary Cardiologist (physician) and Advanced Practice Providers (APPs -  Physician Assistants and Nurse Practitioners) who all work together to provide you with the care you need, when you need it.  We recommend signing up for the patient portal called "MyChart".  Sign up information is provided on this After Visit Summary.  MyChart is used to connect with patients for Virtual Visits (Telemedicine).  Patients are able to view lab/test results, encounter notes, upcoming appointments, etc.  Non-urgent messages can be sent to your provider as well.   To learn more about what you can do with MyChart, go to [email or web address].    Your next appointment:   Follow up after Echo   The format for your next appointment:   In Person  Provider:   Debbe Odea, MD   Other  Instructions

## 2020-06-12 DIAGNOSIS — M25512 Pain in left shoulder: Secondary | ICD-10-CM | POA: Diagnosis not present

## 2020-06-12 DIAGNOSIS — R079 Chest pain, unspecified: Secondary | ICD-10-CM | POA: Diagnosis not present

## 2020-06-12 DIAGNOSIS — R200 Anesthesia of skin: Secondary | ICD-10-CM | POA: Diagnosis not present

## 2020-06-12 DIAGNOSIS — R202 Paresthesia of skin: Secondary | ICD-10-CM | POA: Diagnosis not present

## 2020-06-12 DIAGNOSIS — H538 Other visual disturbances: Secondary | ICD-10-CM | POA: Diagnosis not present

## 2020-06-12 DIAGNOSIS — R0789 Other chest pain: Secondary | ICD-10-CM | POA: Diagnosis not present

## 2020-06-18 ENCOUNTER — Ambulatory Visit (INDEPENDENT_AMBULATORY_CARE_PROVIDER_SITE_OTHER): Payer: Medicaid Other

## 2020-06-18 ENCOUNTER — Other Ambulatory Visit: Payer: Self-pay

## 2020-06-18 DIAGNOSIS — R072 Precordial pain: Secondary | ICD-10-CM | POA: Diagnosis not present

## 2020-06-18 LAB — ECHOCARDIOGRAM COMPLETE
AR max vel: 2.43 cm2
AV Area VTI: 2.72 cm2
AV Area mean vel: 2.5 cm2
AV Mean grad: 2 mmHg
AV Peak grad: 4.7 mmHg
Ao pk vel: 1.08 m/s
Area-P 1/2: 3.81 cm2
Calc EF: 49.4 %
S' Lateral: 3.2 cm
Single Plane A2C EF: 48.6 %
Single Plane A4C EF: 47.4 %

## 2020-06-25 ENCOUNTER — Ambulatory Visit: Payer: Medicaid Other | Admitting: Cardiology

## 2020-06-25 ENCOUNTER — Telehealth: Payer: Self-pay | Admitting: Cardiology

## 2020-06-25 NOTE — Telephone Encounter (Signed)
Virtual would be fine.

## 2020-06-25 NOTE — Telephone Encounter (Signed)
Patient cancelled appt this morning due to running out of gas and wants to reschedule for a virtual visit next week.    Please advise if virtual ok .

## 2020-06-25 NOTE — Telephone Encounter (Signed)
  Patient Consent for Virtual Visit         Dawn Skinner has provided verbal consent on 06/25/2020 for a virtual visit (video or telephone).   CONSENT FOR VIRTUAL VISIT FOR:  Dawn Skinner  By participating in this virtual visit I agree to the following:  I hereby voluntarily request, consent and authorize [facility] and its employed or contracted physicians, physician assistants, nurse practitioners or other licensed health care professionals (the Practitioner), to provide me with telemedicine health care services (the "Services") as deemed necessary by the treating Practitioner. I acknowledge and consent to receive the Services by the Practitioner via telemedicine. I understand that the telemedicine visit will involve communicating with the Practitioner through live audiovisual communication technology and the disclosure of certain medical information by electronic transmission. I acknowledge that I have been given the opportunity to request an in-person assessment or other available alternative prior to the telemedicine visit and am voluntarily participating in the telemedicine visit.  I understand that I have the right to withhold or withdraw my consent to the use of telemedicine in the course of my care at any time, without affecting my right to future care or treatment, and that the Practitioner or I may terminate the telemedicine visit at any time. I understand that I have the right to inspect all information obtained and/or recorded in the course of the telemedicine visit and may receive copies of available information for a reasonable fee.  I understand that some of the potential risks of receiving the Services via telemedicine include:  Marland Kitchen Delay or interruption in medical evaluation due to technological equipment failure or disruption; . Information transmitted may not be sufficient (e.g. poor resolution of images) to allow for appropriate medical decision making by the Practitioner;  and/or  . In rare instances, security protocols could fail, causing a breach of personal health information.  Furthermore, I acknowledge that it is my responsibility to provide information about my medical history, conditions and care that is complete and accurate to the best of my ability. I acknowledge that Practitioner's advice, recommendations, and/or decision may be based on factors not within their control, such as incomplete or inaccurate data provided by me or distortions of diagnostic images or specimens that may result from electronic transmissions. I understand that the practice of medicine is not an exact science and that Practitioner makes no warranties or guarantees regarding treatment outcomes. I acknowledge that a copy of this consent can be made available to me via my patient portal [facility] MyChart), or I can request a printed copy by calling the office of [facility].    I understand that my insurance will be billed for this visit.   I have read or had this consent read to me. . I understand the contents of this consent, which adequately explains the benefits and risks of the Services being provided via telemedicine.  . I have been provided ample opportunity to ask questions regarding this consent and the Services and have had my questions answered to my satisfaction. . I give my informed consent for the services to be provided through the use of telemedicine in my medical care

## 2020-07-01 ENCOUNTER — Other Ambulatory Visit: Payer: Self-pay

## 2020-07-01 ENCOUNTER — Encounter: Payer: Self-pay | Admitting: Cardiology

## 2020-07-01 ENCOUNTER — Telehealth (INDEPENDENT_AMBULATORY_CARE_PROVIDER_SITE_OTHER): Payer: Medicaid Other | Admitting: Cardiology

## 2020-07-01 DIAGNOSIS — I10 Essential (primary) hypertension: Secondary | ICD-10-CM | POA: Diagnosis not present

## 2020-07-01 DIAGNOSIS — R072 Precordial pain: Secondary | ICD-10-CM

## 2020-07-01 NOTE — Patient Instructions (Signed)
Medication Instructions:  Your physician recommends that you continue on your current medications as directed. Please refer to the Current Medication list given to you today.  *If you need a refill on your cardiac medications before your next appointment, please call your pharmacy*   Lab Work: None ordered If you have labs (blood work) drawn today and your tests are completely normal, you will receive your results only by: MyChart Message (if you have MyChart) OR A paper copy in the mail If you have any lab test that is abnormal or we need to change your treatment, we will call you to review the results.   Testing/Procedures: None ordered   Follow-Up: At [facility], you and your health needs are our priority.  As part of our continuing mission to provide you with exceptional heart care, we have created designated Provider Care Teams.  These Care Teams include your primary Cardiologist (physician) and Advanced Practice Providers (APPs -  Physician Assistants and Nurse Practitioners) who all work together to provide you with the care you need, when you need it.  We recommend signing up for the patient portal called "MyChart".  Sign up information is provided on this After Visit Summary.  MyChart is used to connect with patients for Virtual Visits (Telemedicine).  Patients are able to view lab/test results, encounter notes, upcoming appointments, etc.  Non-urgent messages can be sent to your provider as well.   To learn more about what you can do with MyChart, go to [email or web address].    Your next appointment:   3 month(s)  The format for your next appointment:   In Person  Provider:   Brian Agbor-Etang, MD   Other Instructions   

## 2020-07-01 NOTE — Progress Notes (Signed)
Virtual Visit via Video Note   This visit type was conducted due to national recommendations for restrictions regarding the COVID-19 Pandemic (e.g. social distancing) in an effort to limit this patient's exposure and mitigate transmission in our community.  Due to her co-morbid illnesses, this patient is at least at moderate risk for complications without adequate follow up.  This format is felt to be most appropriate for this patient at this time.  All issues noted in this document were discussed and addressed.  A limited physical exam was performed with this format.  Please refer to the patient's chart for her consent to telehealth for [facility].      Date:  07/01/2020   ID:  Dawn Skinner, DOB 1990/12/05, MRN [id number]  Patient Location: Home Provider Location: Office/Clinic  PCP:  Tarri Fuller, FNP  Cardiologist:  Debbe Odea, MD  Electrophysiologist:  None   Evaluation Performed:  Follow-Up Visit  Chief Complaint: Chest pain  History of Present Illness:    Dawn Skinner is a 30 y.o. female with history of hypertension who presents for follow-up.  She was previously seen due to chest pain, qualities seem pleuritic in nature.  Echocardiogram was ordered to evaluate pericardial effusion/inflammation.  She states her symptoms of chest discomfort have overall improved.  Symptoms sometimes alleviated or made worse with palpation.  Blood pressures at home have been in the 140s to 150s range.    Past Medical History:  Diagnosis Date  . Frequent headaches   . Hypertension   . Iron deficiency anemia    Past Surgical History:  Procedure Laterality Date  . NO PAST SURGERIES       No outpatient medications have been marked as taking for the 07/01/20 encounter (Video Visit) with Debbe Odea, MD.     Allergies:   Patient has no known allergies.   Social History   Tobacco Use  . Smoking status: Never Smoker  . Smokeless tobacco: Never Used  Vaping Use   . Vaping Use: Never used  Substance Use Topics  . Alcohol use: Yes    Alcohol/week: 3.0 standard drinks    Types: 3 Glasses of wine per week    Comment: 2-3 weekly   . Drug use: No     Family Hx: The patient's family history includes Colon polyps in her maternal grandfather; Diabetes in her father and paternal grandmother; Diabetes Mellitus II in her mother; Hypertension in her father and mother; Stroke in her mother.  ROS:   Please see the history of present illness.     All other systems reviewed and are negative.   Prior CV studies:   The following studies were reviewed today:    Labs/Other Tests and Data Reviewed:    EKG:  No ECG reviewed.  Recent Labs: 02/02/2020: TSH 0.85 05/02/2020: Hemoglobin 13.5; Platelets 206 06/02/2020: ALT 11; BUN 8; Creat 0.92; Potassium 4.1; Sodium 140   Recent Lipid Panel Lab Results  Component Value Date/Time   CHOL 160 02/02/2020 09:09 AM   TRIG 73 02/02/2020 09:09 AM   HDL 45 (L) 02/02/2020 09:09 AM   CHOLHDL 3.6 02/02/2020 09:09 AM   LDLCALC 99 02/02/2020 09:09 AM    Wt Readings from Last 3 Encounters:  06/03/20 200 lb (90.7 kg)  06/02/20 200 lb 9.6 oz (91 kg)  05/19/20 197 lb 9.6 oz (89.6 kg)     Objective:    Vital Signs:  There were no vitals taken for this visit.   GEN:  no  acute distress EYES:  sclerae anicteric, EOMI - Extraocular Movements Intact  ASSESSMENT & PLAN:    1. Chest pain, pleuritic in nature, echo showed normal systolic and diastolic function, EF 55%.  No evidence of pericardial effusion. 2. Hypertension, continue HCTZ 25 mg daily for now.  Low-salt diet advised.  Check blood pressure frequently and keep BP log.  Last 2 BP checks noted in the chart appear controlled.    Follow-up in 3 months for BP check.  If BP is controlled, no additional medications needed.  If elevated, consider adding Norvasc.  COVID-19 Education: The signs and symptoms of COVID-19 were discussed with the patient and how to  seek care for testing (follow up with PCP or arrange E-visit).  The importance of social distancing was discussed today.  Time:   Today, I have spent 35 minutes with the patient with telehealth technology discussing the above problems.     Medication Adjustments/Labs and Tests Ordered: Current medicines are reviewed at length with the patient today.  Concerns regarding medicines are outlined above.   Tests Ordered: No orders of the defined types were placed in this encounter.   Medication Changes: No orders of the defined types were placed in this encounter.   Follow Up:  In Person in 3 month(s)  Signed, Debbe Odea, MD  07/01/2020 12:55 PM    [facility]

## 2020-08-12 ENCOUNTER — Other Ambulatory Visit: Payer: Self-pay

## 2020-08-12 ENCOUNTER — Other Ambulatory Visit (HOSPITAL_COMMUNITY)
Admission: RE | Admit: 2020-08-12 | Discharge: 2020-08-12 | Disposition: A | Discharge status: Home / Self Care | Payer: Medicaid Other | Source: Ambulatory Visit | Attending: Physician Assistant | Admitting: Physician Assistant

## 2020-08-12 ENCOUNTER — Ambulatory Visit: Payer: Medicaid Other | Admitting: Physician Assistant

## 2020-08-12 VITALS — BP 146/92 | HR 55 | Temp 97.1°F | Resp 17 | Ht 66.0 in | Wt 200.8 lb

## 2020-08-12 DIAGNOSIS — Z30013 Encounter for initial prescription of injectable contraceptive: Secondary | ICD-10-CM | POA: Diagnosis not present

## 2020-08-12 DIAGNOSIS — N898 Other specified noninflammatory disorders of vagina: Secondary | ICD-10-CM

## 2020-08-12 DIAGNOSIS — N912 Amenorrhea, unspecified: Secondary | ICD-10-CM

## 2020-08-12 LAB — POCT URINE PREGNANCY: Preg Test, Ur: NEGATIVE

## 2020-08-12 MED ORDER — MEDROXYPROGESTERONE ACETATE 150 MG/ML IM SUSP
150.0000 mg | INTRAMUSCULAR | 3 refills | Status: DC
Start: 2020-08-12 — End: 2021-05-24

## 2020-08-12 MED ORDER — FLUCONAZOLE 150 MG PO TABS: ORAL_TABLET | ORAL | 0 refills | Status: DC

## 2020-08-12 NOTE — Progress Notes (Signed)
Established patient visit   Patient: Dawn Skinner   DOB: 02/08/1991   30 y.o. Female  MRN: [id number] Visit Date: 08/12/2020  Today's healthcare provider: Trinna Post, PA-C   Chief Complaint  Patient presents with  . Vaginal Pain    Pt complains of a constant burning pain in the vaginal area. She also notice pink color when she wipes. She state that her symptoms started with severe cramp. X 4 days Denies urinary frequency, urgency or dysuria. No odor or discharge noted. She state her menstrual has always been irregular.  . Hypertension    Pt was seen by the cardiologist and was told she could stop the HCTZ.   Subjective    HPI HPI    Vaginal Pain    Comments: Pt complains of a constant burning pain in the vaginal area. She also notice pink color when she wipes. She state that her symptoms started with severe cramp. X 4 days Denies urinary frequency, urgency or dysuria. No odor or discharge noted. She state her menstrual has always been irregular.          Hypertension    Comments: Pt was seen by the cardiologist and was told she could stop the HCTZ.       Last edited by Wilson Singer, CMA on 08/12/2020  8:12 AM. (History)      Denies fevers, chills, nausea, vomiting, vaginal discharge. She is sexually active. She is not using birth control.     Medications: Outpatient Medications Prior to Visit  Medication Sig  . Blood Pressure KIT Please allow patient to choose from what is covered by insurance  . FLUoxetine (PROZAC) 10 MG tablet Take 1 tablet (10 mg total) by mouth daily. (Patient not taking: Reported on 08/12/2020)  . hydrochlorothiazide (HYDRODIURIL) 25 MG tablet Take 1 tablet (25 mg total) by mouth daily. (Patient not taking: Reported on 08/12/2020)  . hydrOXYzine (ATARAX/VISTARIL) 10 MG tablet Take 1 tablet (10 mg total) by mouth 3 (three) times daily as needed. (Patient not taking: Reported on 08/12/2020)  . traZODone (DESYREL) 50 MG tablet Take 0.5-1  tablets (25-50 mg total) by mouth at bedtime as needed for sleep. (Patient not taking: Reported on 08/12/2020)   No facility-administered medications prior to visit.    Review of Systems  All other systems reviewed and are negative.      Objective    BP (!) 146/92 (BP Location: Right Arm, Patient Position: Sitting, Cuff Size: Normal)   Pulse (!) 55   Temp (!) 97.1 F (36.2 C) (Temporal)   Resp 17   Ht $R'5\' 6"'ar$  (1.676 m)   Wt 200 lb 12.8 oz (91.1 kg)   SpO2 100%   BMI 32.41 kg/m     Physical Exam Constitutional:      Appearance: Normal appearance.  Cardiovascular:     Rate and Rhythm: Normal rate.  Pulmonary:     Effort: Pulmonary effort is normal.  Genitourinary:    General: Normal vulva.     Vagina: Vaginal discharge present.     Comments: Clumpy white discharge in vaginal vault.  Neurological:     Mental Status: She is alert.       No results found for any visits on 08/12/20.  Assessment & Plan    1. Vaginal discharge  Suspect yeast, can wait for swab to confirm. Pregnancy test today, if negative can resume depo. Will order and she can return for injection.   - fluconazole (DIFLUCAN)  150 MG tablet; Take one pill on day 1. Take 2nd pill three days later if still symptomatic.  Dispense: 2 tablet; Refill: 0 - Cervicovaginal ancillary only  2. Amenorrhea  - POCT Pregnancy, Urine   Return if symptoms worsen or fail to improve.         Trinna Post, PA-C  [facility] [phone] (phone) [phone] (fax)  [place]

## 2020-08-12 NOTE — Patient Instructions (Signed)
Vaginal Yeast Infection, Adult  Vaginal yeast infection is a condition that causes vaginal discharge as well as soreness, swelling, and redness (inflammation) of the vagina. This is a common condition. Some women get this infection frequently. What are the causes? This condition is caused by a change in the normal balance of the yeast (candida) and bacteria that live in the vagina. This change causes an overgrowth of yeast, which causes the inflammation. What increases the risk? The condition is more likely to develop in women who:  Take antibiotic medicines.  Have diabetes.  Take birth control pills.  Are pregnant.  Douche often.  Have a weak body defense system (immune system).  Have been taking steroid medicines for a long time.  Frequently wear tight clothing. What are the signs or symptoms? Symptoms of this condition include:  White, thick, creamy vaginal discharge.  Swelling, itching, redness, and irritation of the vagina. The lips of the vagina (vulva) may be affected as well.  Pain or a burning feeling while urinating.  Pain during sex. How is this diagnosed? This condition is diagnosed based on:  Your medical history.  A physical exam.  A pelvic exam. Your health care provider will examine a sample of your vaginal discharge under a microscope. Your health care provider may send this sample for testing to confirm the diagnosis. How is this treated? This condition is treated with medicine. Medicines may be over-the-counter or prescription. You may be told to use one or more of the following:  Medicine that is taken by mouth (orally).  Medicine that is applied as a cream (topically).  Medicine that is inserted directly into the vagina (suppository). Follow these instructions at home: Lifestyle  Do not have sex until your health care provider approves. Tell your sex partner that you have a yeast infection. That person should go to his or her health care  provider and ask if they should also be treated.  Do not wear tight clothes, such as pantyhose or tight pants.  Wear breathable cotton underwear. General instructions  Take or apply over-the-counter and prescription medicines only as told by your health care provider.  Eat more yogurt. This may help to keep your yeast infection from returning.  Do not use tampons until your health care provider approves.  Try taking a sitz bath to help with discomfort. This is a warm water bath that is taken while you are sitting down. The water should only come up to your hips and should cover your buttocks. Do this 3-4 times per day or as told by your health care provider.  Do not douche.  If you have diabetes, keep your blood sugar levels under control.  Keep all follow-up visits as told by your health care provider. This is important.   Contact a health care provider if:  You have a fever.  Your symptoms go away and then return.  Your symptoms do not get better with treatment.  Your symptoms get worse.  You have new symptoms.  You develop blisters in or around your vagina.  You have blood coming from your vagina and it is not your menstrual period.  You develop pain in your abdomen. Summary  Vaginal yeast infection is a condition that causes discharge as well as soreness, swelling, and redness (inflammation) of the vagina.  This condition is treated with medicine. Medicines may be over-the-counter or prescription.  Take or apply over-the-counter and prescription medicines only as told by your health care provider.  Do not   douche. Do not have sex or use tampons until your health care provider approves.  Contact a health care provider if your symptoms do not get better with treatment or your symptoms go away and then return. This information is not intended to replace advice given to you by your health care provider. Make sure you discuss any questions you have with your health care  provider. Document Revised: 11/29/2018 Document Reviewed: 09/17/2017 Elsevier Patient Education  2021 Elsevier Inc.  

## 2020-08-15 LAB — CERVICOVAGINAL ANCILLARY ONLY
Bacterial Vaginitis (gardnerella): POSITIVE — AB
Candida Glabrata: NEGATIVE
Candida Vaginitis: POSITIVE — AB
Chlamydia: NEGATIVE
Comment: NEGATIVE
Comment: NEGATIVE
Comment: NEGATIVE
Comment: NEGATIVE
Comment: NEGATIVE
Comment: NORMAL
Neisseria Gonorrhea: NEGATIVE
Trichomonas: NEGATIVE

## 2020-09-29 IMAGING — DX PORTABLE CHEST - 1 VIEW
1 series · 1 of 1 positions shown · non-contrast
Comparison: None.

CLINICAL DATA: Shortness of breath

EXAM:
PORTABLE CHEST 1 VIEW

[chest ap]
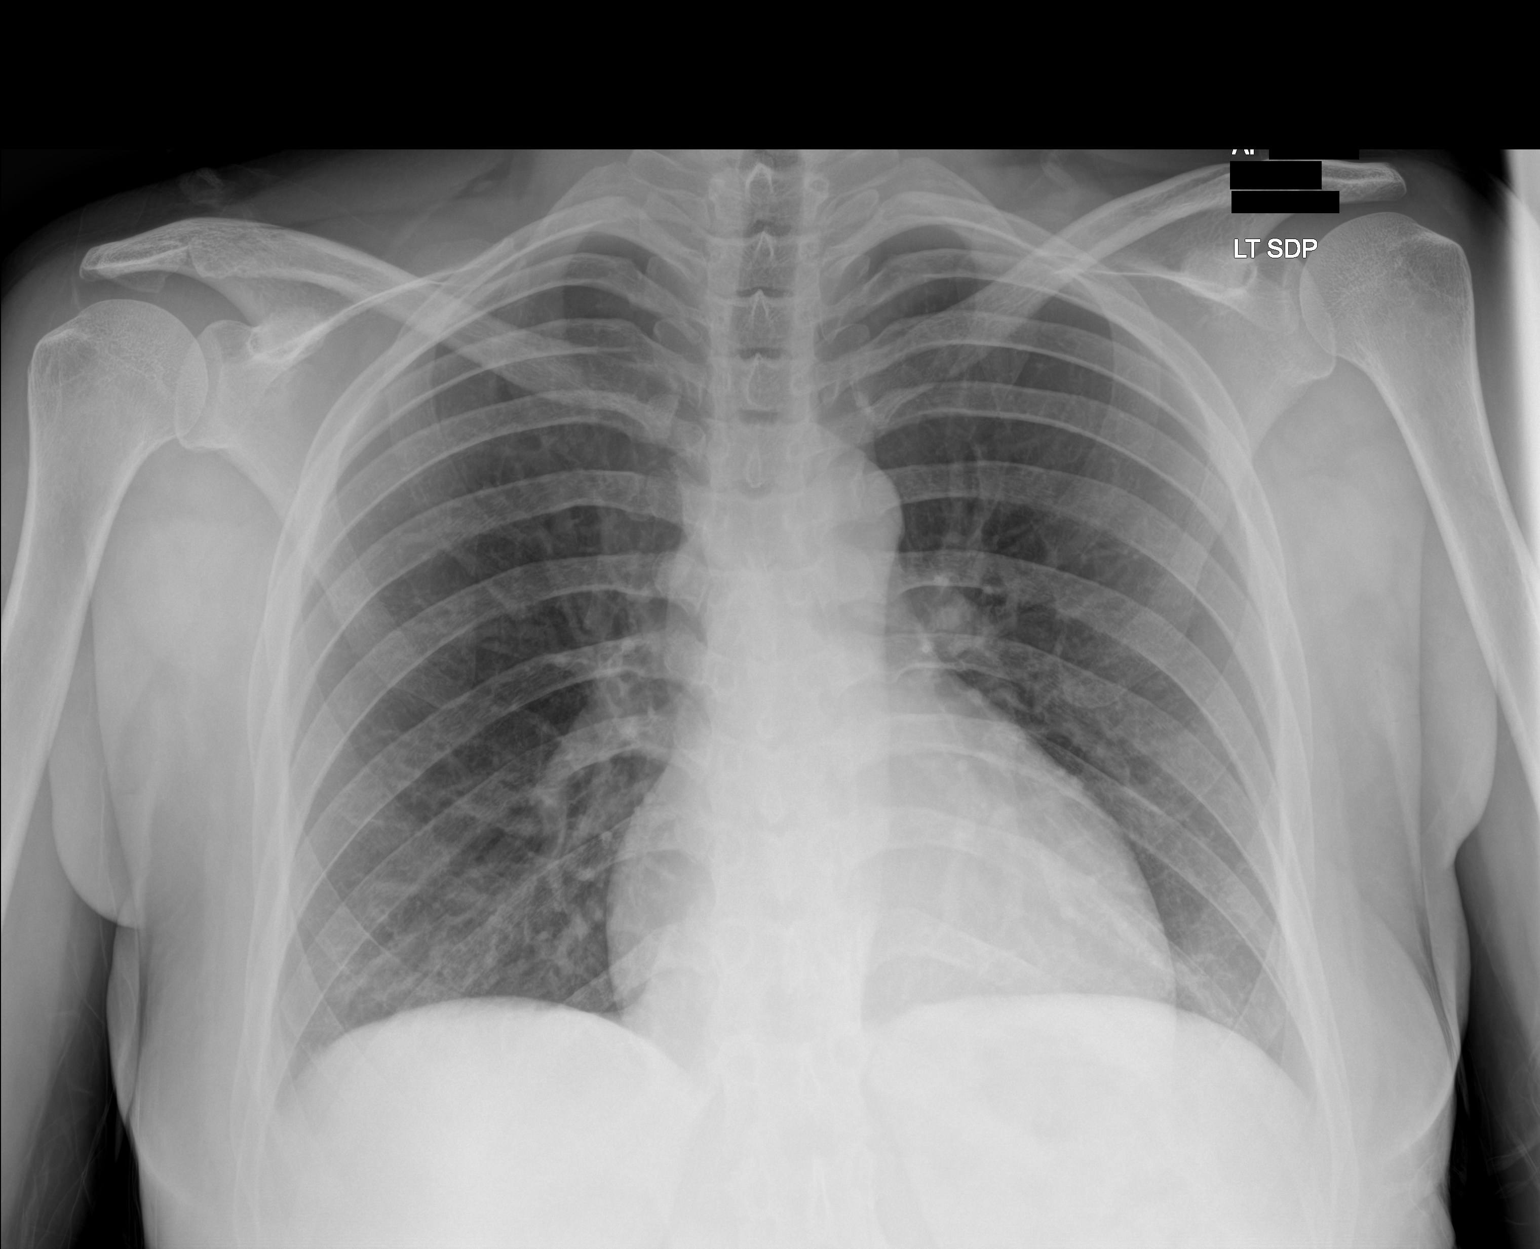

[1 of 1 positions shown; findings below may reference images not displayed]

FINDINGS: Heart and mediastinal contours are within normal limits. No focal
opacities or effusions. No acute bony abnormality.
IMPRESSION: No active disease.

## 2020-09-30 ENCOUNTER — Ambulatory Visit (INDEPENDENT_AMBULATORY_CARE_PROVIDER_SITE_OTHER): Payer: Medicaid Other | Admitting: Internal Medicine

## 2020-09-30 ENCOUNTER — Encounter: Payer: Self-pay | Admitting: Internal Medicine

## 2020-09-30 ENCOUNTER — Other Ambulatory Visit: Payer: Self-pay

## 2020-09-30 VITALS — BP 108/62 | HR 96 | Temp 97.5°F | Resp 18 | Ht 66.0 in | Wt 196.6 lb

## 2020-09-30 DIAGNOSIS — R21 Rash and other nonspecific skin eruption: Secondary | ICD-10-CM | POA: Diagnosis not present

## 2020-09-30 DIAGNOSIS — F5104 Psychophysiologic insomnia: Secondary | ICD-10-CM | POA: Diagnosis not present

## 2020-09-30 DIAGNOSIS — Z6831 Body mass index (BMI) 31.0-31.9, adult: Secondary | ICD-10-CM | POA: Diagnosis not present

## 2020-09-30 DIAGNOSIS — F419 Anxiety disorder, unspecified: Secondary | ICD-10-CM

## 2020-09-30 DIAGNOSIS — Z3042 Encounter for surveillance of injectable contraceptive: Secondary | ICD-10-CM

## 2020-09-30 DIAGNOSIS — E6609 Other obesity due to excess calories: Secondary | ICD-10-CM | POA: Diagnosis not present

## 2020-09-30 DIAGNOSIS — I10 Essential (primary) hypertension: Secondary | ICD-10-CM

## 2020-09-30 LAB — POCT URINE PREGNANCY: Preg Test, Ur: NEGATIVE

## 2020-09-30 MED ORDER — CLOBETASOL PROPIONATE 0.05 % EX CREA
1.0000 | TOPICAL_CREAM | Freq: Two times a day (BID) | CUTANEOUS | 0 refills | Status: DC
Start: 2020-09-30 — End: 2021-05-24

## 2020-09-30 NOTE — Assessment & Plan Note (Signed)
Currently not an issue Will monitor 

## 2020-09-30 NOTE — Progress Notes (Signed)
Subjective:    Patient ID: Dawn Skinner, female    DOB: [birth year], 30 y.o.   MRN: [id number]  HPI  Patient presents to clinic today for for follow-up of chronic conditions.  She is establishing care with me today, transferring call from Lonie Peak, NP.  HTN: Her BP today is 108/72.  She is taking HCTZ as prescribed.  ECG from 05/2020 reviewed.  Anxiety: In remission. She is no longer taking Fluoxetine and Hydroxyzine.  She is not currently seeing a therapist.  She denies depression, SI/HI.  Insomnia: Currently not a issue off Trazadone. There is no sleep study on file.  She also reports a rash to the right side of her neck. She noticed this 4 months ago. The rash does not itch or burn. It has not spread. She has tried Hydrocortisone cream OTC and been treated for scabies without any relief of symptoms.  She would also like to get restarted on her Depo Provera. She has not had a shot in the last 3-4 months. She reports irregular menses which is not new for her. She is intermittently sexually active.  Review of Systems      Past Medical History:  Diagnosis Date  . Frequent headaches   . Hypertension   . Iron deficiency anemia     Current Outpatient Medications  Medication Sig Dispense Refill  . Blood Pressure KIT Please allow patient to choose from what is covered by insurance 1 kit 0  . fluconazole (DIFLUCAN) 150 MG tablet Take one pill on day 1. Take 2nd pill three days later if still symptomatic. 2 tablet 0  . FLUoxetine (PROZAC) 10 MG tablet Take 1 tablet (10 mg total) by mouth daily. (Patient not taking: Reported on 08/12/2020) 30 tablet 1  . hydrochlorothiazide (HYDRODIURIL) 25 MG tablet Take 1 tablet (25 mg total) by mouth daily. (Patient not taking: Reported on 08/12/2020) 90 tablet 1  . hydrOXYzine (ATARAX/VISTARIL) 10 MG tablet Take 1 tablet (10 mg total) by mouth 3 (three) times daily as needed. (Patient not taking: Reported on 08/12/2020) 30 tablet 0  .  medroxyPROGESTERone (DEPO-PROVERA) 150 MG/ML injection Inject 1 mL (150 mg total) into the muscle every 3 (three) months. 1 mL 3  . traZODone (DESYREL) 50 MG tablet Take 0.5-1 tablets (25-50 mg total) by mouth at bedtime as needed for sleep. (Patient not taking: Reported on 08/12/2020) 30 tablet 1   No current facility-administered medications for this visit.    No Known Allergies  Family History  Problem Relation Age of Onset  . Hypertension Mother   . Stroke Mother   . Diabetes Mellitus II Mother   . Hypertension Father   . Diabetes Father   . Colon polyps Maternal Grandfather   . Diabetes Paternal Grandmother     Social History   Socioeconomic History  . Marital status: Single    Spouse name: Not on file  . Number of children: Not on file  . Years of education: Not on file  . Highest education level: Not on file  Occupational History  . Not on file  Tobacco Use  . Smoking status: Never Smoker  . Smokeless tobacco: Never Used  Vaping Use  . Vaping Use: Never used  Substance and Sexual Activity  . Alcohol use: Yes    Alcohol/week: 3.0 standard drinks    Types: 3 Glasses of wine per week    Comment: 2-3 weekly   . Drug use: No  . Sexual activity: Yes  Partners: Male    Birth control/protection: Injection, Condom  Other Topics Concern  . Not on file  Social History Narrative  . Not on file   Social Determinants of Health   Financial Resource Strain: Not on file  Food Insecurity: Not on file  Transportation Needs: Not on file  Physical Activity: Not on file  Stress: Not on file  Social Connections: Not on file  Intimate Partner Violence: Not on file     Constitutional: Denies fever, malaise, fatigue, headache or abrupt weight changes.  Respiratory: Denies difficulty breathing, shortness of breath, cough or sputum production.   Cardiovascular: Denies chest pain, chest tightness, palpitations or swelling in the hands or feet.  Gastrointestinal: Denies  abdominal pain, bloating, constipation, diarrhea or blood in the stool.  GU: Pt reports irregular menses. Denies urgency, frequency, pain with urination, burning sensation, blood in urine, odor or discharge. Skin: Patient reports rash to right side of neck.  Denies redness, lesions or ulcercations.  Neurological:  Denies dizziness, difficulty with memory, difficulty with speech or problems with balance and coordination.  Psych:  Denies anxiety,  depression, SI/HI.  No other specific complaints in a complete review of systems (except as listed in HPI above).  Objective:   Physical Exam   BP 108/62 (BP Location: Left Arm, Patient Position: Sitting, Cuff Size: Normal)   Pulse 96   Temp (!) 97.5 F (36.4 C) (Temporal)   Resp 18   Ht 5' 6" (1.676 m)   Wt 196 lb 9.6 oz (89.2 kg)   LMP 09/16/2020   SpO2 99%   BMI 31.73 kg/m   Wt Readings from Last 3 Encounters:  08/12/20 200 lb 12.8 oz (91.1 kg)  06/03/20 200 lb (90.7 kg)  06/02/20 200 lb 9.6 oz (91 kg)    General: Appears her stated age, obese, in NAD. Skin: Warm, dry and intact. Grouped vesicular rash noted at hairline, right side neck. Excoriation noted. HEENT: Head: normal shape and size; Eyes: sclera white and EOMs intact;  Cardiovascular: Normal rate and rhythm. S1,S2 noted.  No murmur, rubs or gallops noted.  Pulmonary/Chest: Normal effort and positive vesicular breath sounds. No respiratory distress. No wheezes, rales or ronchi noted.  Musculoskeletal:No difficulty with gait.  Neurological: Alert and oriented.    BMET    Component Value Date/Time   NA 140 06/02/2020 0855   K 4.1 06/02/2020 0855   CL 105 06/02/2020 0855   CO2 29 06/02/2020 0855   GLUCOSE 88 06/02/2020 0855   BUN 8 06/02/2020 0855   CREATININE 0.92 06/02/2020 0855   CALCIUM 9.4 06/02/2020 0855   GFRNONAA 84 06/02/2020 0855   GFRAA 98 06/02/2020 0855    Lipid Panel     Component Value Date/Time   CHOL 160 02/02/2020 0909   TRIG 73 02/02/2020  0909   HDL 45 (L) 02/02/2020 0909   CHOLHDL 3.6 02/02/2020 0909   LDLCALC 99 02/02/2020 0909    CBC    Component Value Date/Time   WBC 9.7 05/02/2020 1914   RBC 4.97 05/02/2020 1914   HGB 13.5 05/02/2020 1914   HCT 41.6 05/02/2020 1914   PLT 206 05/02/2020 1914   MCV 83.7 05/02/2020 1914   MCH 27.2 05/02/2020 1914   MCHC 32.5 05/02/2020 1914   RDW 14.4 05/02/2020 1914   LYMPHSABS 2,267 02/02/2020 0909   MONOABS 0.6 01/07/2020 1503   EOSABS 198 02/02/2020 0909   BASOSABS 40 02/02/2020 0909    Hgb A1C Lab Results  Component Value   Date   HGBA1C 5.4 02/02/2020           Assessment & Plan:   Rash of Neck:  Almost seem herpetic in nature but given duration of symptoms this in not likely Will trial Colbeatsol 0.5% BID prn Consider referral to dermatology if symptoms persist  Encounter for Initiation of Injectable Contraceptive:  Urine Hcg negative She will RTC in 1 week for nurse visit for urine preg If negative, she will bring Depo Provera with her for injection  Return precautions discussed  Webb Silversmith, NP This visit occurred during the SARS-CoV-2 public health emergency.  Safety protocols were in place, including screening questions prior to the visit, additional usage of staff PPE, and extensive cleaning of exam room while observing appropriate contact time as indicated for disinfecting solutions.

## 2020-09-30 NOTE — Assessment & Plan Note (Signed)
Controlled on HCTZ Reinforced DASH diet and exercise for weight loss Kidney function reviewed-normal

## 2020-09-30 NOTE — Patient Instructions (Signed)
Eczema Eczema refers to a group of skin conditions that cause skin to become rough and inflamed. Each type of eczema has different triggers, symptoms, and treatments. Eczema of any type is usually itchy. Symptoms range from mild to severe. Eczema is not spread from person to person (is not contagious). It can appear on different parts of the body at different times. One person's eczema may look different from another person's eczema. What are the causes? The exact cause of this condition is not known. However, exposure to certain environmental factors, irritants, and allergens can make the condition worse. What are the signs or symptoms? Symptoms of this condition depend on the type of eczema you have. The types include:  Contact dermatitis. There are two kinds: ? Irritant contact dermatitis. This happens when something irritates the skin and causes a rash. ? Allergic contact dermatitis. This happens when your skin comes in contact with something you are allergic to (allergens). This can include poison ivy, chemicals, or medicines that were applied to your skin.  Atopic dermatitis. This is a long-term (chronic) skin disease that keeps coming back (recurring). It is the most common type of eczema. Usual symptoms are a red rash and itchy, dry, scaly skin. It usually starts showing signs in infancy and can last through adulthood.  Dyshidrotic eczema. This is a form of eczema on the hands and feet. It shows up as very itchy, fluid-filled blisters. It can affect people of any age but is more common before age 40.  Hand eczema. This causes very itchy areas of skin on the palms and sides of the hands and fingers. This type of eczema is common in industrial jobs where you may be exposed to different types of irritants.  Lichen simplex chronicus. This type of eczema occurs when a person constantly scratches one area of the body. Repeated scratching of the area leads to thickened skin (lichenification). This  condition can accompany other types of eczema. It is more common in adults but may also be seen in children.  Nummular eczema. This is a common type of eczema that most often affects the lower legs and the backs of the hands. It typically causes an itchy, red, circular, crusty lesion (plaque). Scratching may become a habit and can cause bleeding. Nummular eczema occurs most often in middle-aged or older people.  Seborrheic dermatitis. This is a common skin disease that mainly affects the scalp. It may also affect other oily areas of the body, such as the face, sides of the nose, eyebrows, ears, eyelids, and chest. It is marked by small scaling and redness of the skin (erythema). This can affect people of all ages. In infants, this condition is called cradle cap.  Stasis dermatitis. This is a common skin disease that can cause itching, scaling, and hyperpigmentation, usually on the legs and feet. It occurs most often in people who have a condition that prevents blood from being pumped through the veins in the legs (chronic venous insufficiency). Stasis dermatitis is a chronic condition that needs long-term management.   How is this diagnosed? This condition may be diagnosed based on:  A physical exam of your skin.  Your medical history.  Skin patch tests. These tests involve using patches that contain possible allergens and placing them on your back. Your health care provider will check in a few days to see if an allergic reaction occurred. How is this treated? Treatment for eczema is based on the type of eczema you have. You may be   given hydrocortisone steroid medicine or antihistamines. These can relieve itching quickly and help reduce inflammation. These may be prescribed or purchased over the counter, depending on the strength that is needed. Follow these instructions at home:  Take or apply over-the-counter and prescription medicines only as told by your health care provider.  Use creams or  ointments to moisturize your skin. Do not use lotions.  Learn what triggers or irritates your symptoms so you can avoid these things.  Treat symptom flare-ups quickly.  Do not scratch your skin. This can make your rash worse.  Keep all follow-up visits. This is important. Where to find more information  American Academy of Dermatology: aad.org  National Eczema Association: nationaleczema.org  The Society for Pediatric Dermatology: pedsderm.net Contact a health care provider if:  You have severe itching, even with treatment.  You scratch your skin regularly until it bleeds.  Your rash looks different than usual.  Your skin is painful, swollen, or more red than usual.  You have a fever. Summary  Eczema refers to a group of skin conditions that cause skin to become rough and inflamed. Each type has different triggers.  Eczema of any type causes itching that may range from mild to severe.  Treatment varies based on the type of eczema you have. Hydrocortisone steroid medicine or antihistamines can help with itching and inflammation.  Protecting your skin is the best way to prevent eczema. Use creams or ointments to moisturize your skin. Avoid triggers and irritants. Treat flare-ups quickly. This information is not intended to replace advice given to you by your health care provider. Make sure you discuss any questions you have with your health care provider. Document Revised: 02/09/2020 Document Reviewed: 02/09/2020 Elsevier Patient Education  2021 Elsevier Inc.  

## 2020-10-08 ENCOUNTER — Other Ambulatory Visit: Payer: Self-pay

## 2020-10-08 ENCOUNTER — Ambulatory Visit (INDEPENDENT_AMBULATORY_CARE_PROVIDER_SITE_OTHER): Payer: Medicaid Other

## 2020-10-08 DIAGNOSIS — Z30013 Encounter for initial prescription of injectable contraceptive: Secondary | ICD-10-CM

## 2020-10-08 LAB — POCT URINE PREGNANCY: Preg Test, Ur: NEGATIVE

## 2020-10-08 MED ORDER — MEDROXYPROGESTERONE ACETATE 150 MG/ML IM SUSP: 150.0000 mg | Freq: Once | INTRAMUSCULAR | Status: DC

## 2020-12-24 ENCOUNTER — Ambulatory Visit: Payer: Medicaid Other

## 2021-05-20 ENCOUNTER — Ambulatory Visit: Payer: Self-pay | Admitting: *Deleted

## 2021-05-20 NOTE — Telephone Encounter (Signed)
Per agent: "Pt reports rash since the summer and bright white discharge (did not disclose where)  Best contact: [phone] "   Chief Complaint: rash Symptoms: large red patchy, itching areas, bends of arms, neck, chest. Worse with heat Frequency: Onset "Summer" Pertinent Negatives: Patient denies fever, pain, swelling, joint pain Disposition: [] ED /[] Urgent Care (no appt availability in office) / [x] Appointment(In office/virtual)/ []  [facility]/ [] Home Care/ [] Refused Recommended Disposition /[] [facility]/ []  Follow-up with PCP Additional Notes: Care advise provided. States hydrocortisone helpful.    Reason for Disposition  Mild widespread rash  Answer Assessment - Initial Assessment Questions 1. APPEARANCE of RASH: "Describe the rash." (e.g., spots, blisters, raised areas, skin peeling, scaly)     Raised red, blistered, moist 2. SIZE: "How big are the spots?" (e.g., tip of pen, eraser, coin; inches, centimeters)     Forearm, covers bend in arm, side of neck 3. LOCATION: "Where is the rash located?"     Neck right forearm 4. COLOR: "What color is the rash?" (Note: It is difficult to assess rash color in people with darker-colored skin. When this situation occurs, simply ask the caller to describe what they see.)     red 5. ONSET: "When did the rash begin?"     Summer 6. FEVER: "Do you have a fever?" If Yes, ask: "What is your temperature, how was it measured, and when did it start?"     no 7. ITCHING: "Does the rash itch?" If Yes, ask: "How bad is the itch?" (Scale 1-10; or mild, moderate, severe)     7/10 8. CAUSE: "What do you think is causing the rash?"     Unsure 9. MEDICINE FACTORS: "Have you started any new medicines within the last 2 weeks?" (e.g., antibiotics)      No 10. OTHER SYMPTOMS: "Do you have any other symptoms?" (e.g., dizziness, headache, sore throat, joint pain)       Heat makes it worse. Hydrocortisone helps for a while. 11.  PREGNANCY: "Is there any chance you are pregnant?" "When was your last menstrual period?"  Protocols used: Rash or Redness - [facility]

## 2021-05-24 ENCOUNTER — Other Ambulatory Visit: Payer: Self-pay

## 2021-05-24 ENCOUNTER — Ambulatory Visit: Payer: Medicaid Other | Admitting: Internal Medicine

## 2021-05-24 ENCOUNTER — Encounter: Payer: Self-pay | Admitting: Internal Medicine

## 2021-05-24 VITALS — BP 123/85 | HR 69 | Temp 97.1°F | Resp 18 | Ht 66.0 in | Wt 213.6 lb

## 2021-05-24 DIAGNOSIS — L409 Psoriasis, unspecified: Secondary | ICD-10-CM

## 2021-05-24 MED ORDER — CLOBETASOL PROPIONATE 0.05 % EX CREA: 1.0000 "application " | TOPICAL_CREAM | Freq: Two times a day (BID) | CUTANEOUS | 1 refills | Status: DC

## 2021-05-24 NOTE — Assessment & Plan Note (Signed)
Rx for Clobetasol cream 0.05% twice daily Avoid excessively hot showers Referral to dermatology for further evaluation treatment

## 2021-05-24 NOTE — Progress Notes (Signed)
Subjective:    Patient ID: Dawn Skinner, female    DOB: 1990/08/18, 31 y.o.   MRN: [id number]  HPI  Pt presents to the clinic today with c/o a rash. She noticed this several months ago. The rash is located on the right side of her neck and has spread to her upper chest, back and inner elbows. The rash itches. She has noticed that it seems worse in the heat. She denies changes in soaps, lotions, detergents, diet or medication. She has tried Hydrocortisone OTC with minimal relief of symptoms. No one in her home has a similar rash.  Review of Systems     Past Medical History:  Diagnosis Date   Frequent headaches    Hypertension    Iron deficiency anemia     Current Outpatient Medications  Medication Sig Dispense Refill   clobetasol cream (TEMOVATE) [id number] % Apply 1 application topically 2 (two) times daily. 30 g 0   hydrochlorothiazide (HYDRODIURIL) 25 MG tablet Take 1 tablet (25 mg total) by mouth daily. 90 tablet 1   medroxyPROGESTERone (DEPO-PROVERA) 150 MG/ML injection Inject 1 mL (150 mg total) into the muscle every 3 (three) months. (Patient not taking: Reported on 09/30/2020) 1 mL 3   Current Facility-Administered Medications  Medication Dose Route Frequency Provider Last Rate Last Admin   medroxyPROGESTERone (DEPO-PROVERA) injection 150 mg  150 mg Intramuscular Once Lenward Able W, NP        No Known Allergies  Family History  Problem Relation Age of Onset   Hypertension Mother    Stroke Mother    Diabetes Mellitus II Mother    Hypertension Father    Diabetes Father    Colon polyps Maternal Grandfather    Diabetes Paternal Grandmother     Social History   Socioeconomic History   Marital status: Single    Spouse name: Not on file   Number of children: Not on file   Years of education: Not on file   Highest education level: Not on file  Occupational History   Not on file  Tobacco Use   Smoking status: Never   Smokeless tobacco: Never  Vaping Use   Vaping  Use: Never used  Substance and Sexual Activity   Alcohol use: Yes    Alcohol/week: 3.0 standard drinks    Types: 3 Glasses of wine per week    Comment: 2-3 weekly    Drug use: No   Sexual activity: Yes    Partners: Male    Birth control/protection: Injection, Condom  Other Topics Concern   Not on file  Social History Narrative   Not on file   Social Determinants of Health   Financial Resource Strain: Not on file  Food Insecurity: Not on file  Transportation Needs: Not on file  Physical Activity: Not on file  Stress: Not on file  Social Connections: Not on file  Intimate Partner Violence: Not on file     Constitutional: Denies fever, malaise, fatigue, headache or abrupt weight changes.  Respiratory: Denies difficulty breathing, shortness of breath, cough or sputum production.   Cardiovascular: Denies chest pain, chest tightness, palpitations or swelling in the hands or feet.  Skin: Pt reports rash. Denies redness, lesions or ulcercations.   No other specific complaints in a complete review of systems (except as listed in HPI above).  Objective:   Physical Exam  BP 123/85 (BP Location: Right Arm, Patient Position: Sitting, Cuff Size: Normal)    Pulse 69    Temp Marland Kitchen)  97.1 F (36.2 C) (Temporal)    Resp 18    Ht 5\' 6"  (1.676 m)    Wt 213 lb 9.6 oz (96.9 kg)    SpO2 100%    BMI 34.48 kg/m   Wt Readings from Last 3 Encounters:  09/30/20 196 lb 9.6 oz (89.2 kg)  08/12/20 200 lb 12.8 oz (91.1 kg)  06/03/20 200 lb (90.7 kg)    General: Appears her stated age, obese, in NAD. Skin: Grouped papular lesions on erythematous base noted on right side of neck, upper back and right antecubital fossa. Cardiovascular: Normal rate and rhythm.  Pulmonary/Chest: Normal effort and positive vesicular breath sounds.  Neurological: Alert and oriented.    BMET    Component Value Date/Time   NA 140 06/02/2020 0855   K 4.1 06/02/2020 0855   CL 105 06/02/2020 0855   CO2 29 06/02/2020 0855    GLUCOSE 88 06/02/2020 0855   BUN 8 06/02/2020 0855   CREATININE 0.92 06/02/2020 0855   CALCIUM 9.4 06/02/2020 0855   GFRNONAA 84 06/02/2020 0855   GFRAA 98 06/02/2020 0855    Lipid Panel     Component Value Date/Time   CHOL 160 02/02/2020 0909   TRIG 73 02/02/2020 0909   HDL 45 (L) 02/02/2020 0909   CHOLHDL 3.6 02/02/2020 0909   LDLCALC 99 02/02/2020 0909    CBC    Component Value Date/Time   WBC 9.7 05/02/2020 1914   RBC 4.97 05/02/2020 1914   HGB 13.5 05/02/2020 1914   HCT 41.6 05/02/2020 1914   PLT 206 05/02/2020 1914   MCV 83.7 05/02/2020 1914   MCH 27.2 05/02/2020 1914   MCHC 32.5 05/02/2020 1914   RDW 14.4 05/02/2020 1914   LYMPHSABS 2,267 02/02/2020 0909   MONOABS 0.6 01/07/2020 1503   EOSABS 198 02/02/2020 0909   BASOSABS 40 02/02/2020 0909    Hgb A1C Lab Results  Component Value Date   HGBA1C 5.4 02/02/2020            Assessment & Plan:     .Webb Silversmith, NP This visit occurred during the SARS-CoV-2 public health emergency.  Safety protocols were in place, including screening questions prior to the visit, additional usage of staff PPE, and extensive cleaning of exam room while observing appropriate contact time as indicated for disinfecting solutions.

## 2021-05-24 NOTE — Patient Instructions (Signed)
Psoriasis Psoriasis is a long-term (chronic) skin condition. It occurs because your body's defense system (immune system) causes skin cells to form too quickly. This causes raised, red patches (plaques) on your skin that look silvery. The patches may be on all areas of your body.They can be any size or shape. Psoriasis can come and go. It can range from mild to very bad. It cannot be passed from one person to another (is not contagious). There is no cure for this condition, but it can be helped with treatment. What are the causes? The cause of psoriasis is not known. Some things can make it worse. These are: Skin damage, such as cuts, scrapes, sunburn, and dryness. Not getting enough sunlight. Some medicines. Alcohol. Tobacco. Stress. Infections. What increases the risk? Having a family member with psoriasis. Being very overweight (obese). Being 20-40 years old. Taking certain medicines. What are the signs or symptoms? There are different types of psoriasis. The types are: Plaque. This is the most common. Symptoms include red, raised patches with a silvery coating. These may be itchy. Your nails may be crumbly or fall off. Guttate. Symptoms include small red spots on your stomach area, arms, and legs. These may happen after you have been sick, such as with strep throat. Inverse. Symptoms include patches in your armpits, under your breasts, private areas, or on your butt. Pustular. Symptoms include pus-filled bumps on the palms of your hands or the soles of your feet. You also may feel very tired, weak, have a fever, and not be hungry. Erythrodermic. Symptoms include bright red skin that looks burned. You may have a fast heartbeat and a body temperature that is too high or too low. You may be itchy or in pain. Sebopsoriasis. Symptoms include red patches on your scalp, forehead, and face that are greasy. Psoriatic arthritis. Symptoms include swollen, painful joints along with scaly skin  patches. How is this treated? There is no cure for this condition, but treatment can: Help your skin heal. Lessen itching and irritation and swelling (inflammation). Slow the growth of new skin cells. Help your body's defense system respond better to your skin. Treatment may include: Creams or ointments. Light therapy. This may include natural sunlight or light therapy in a doctor's office. Medicines. These can help your body better manage skin cells. They may be used with light therapy or ointments. Medicines may include pills or injections. You may also get antibiotic medicines if you have an infection. Follow these instructions at home: Skin Care Apply lotion to your skin as needed. Only use those that your doctor has said are okay. Apply cool, wet cloths (cold compresses) to the affected areas. Do not use a hot tub or take hot showers. Use slightly warm, not hot, water when taking showers and baths. Do not scratch your skin. Lifestyle  Do not use any products that contain nicotine or tobacco, such as cigarettes, e-cigarettes, and chewing tobacco. If you need help quitting, ask your doctor. Lower your stress. Keep a healthy weight. Go out in the sun as told by your doctor. Do not get sunburned. Join a support group.  Medicines Take or use over-the-counter and prescription medicines only as told by your doctor. If you were prescribed an antibiotic medicine, take it as told by your doctor. Do not stop using the antibiotic even if you start to feel better. Alcohol use If you drink alcohol: Limit how much you use: 0-1 drink a day for women. 0-2 drinks a day for men.   Be aware of how much alcohol is in your drink. In the [place] one drink equals one 12 oz bottle of beer (355 mL), one 5 oz glass of wine (148 mL), or one 1 oz glass of hard liquor (44 mL). General instructions Keep a journal to track the things that cause symptoms (triggers). Try to avoid these things. See a counselor if  you feel the support would help. Keep all follow-up visits as told by your doctor. This is important. Contact a doctor if: You have a fever. Your pain gets worse. You have more redness or warmth in the affected areas. You have new or worse pain or stiffness in your joints. Your nails start to break easily or pull away from the nail bed. You feel very sad (depressed). Summary Psoriasis is a long-term (chronic) skin condition. There is no cure for this condition, but treatment can help manage it. Keep a journal to track the things that cause symptoms. Take or use over-the-counter and prescription medicines only as told by your doctor. Keep all follow-up visits as told by your doctor. This is important. This information is not intended to replace advice given to you by your health care provider. Make sure you discuss any questions you have with your healthcare provider. Document Revised: 03/05/2018 Document Reviewed: 03/05/2018 Elsevier Patient Education  2022 Elsevier Inc.  

## 2021-06-30 ENCOUNTER — Encounter: Payer: Self-pay | Admitting: Internal Medicine

## 2021-06-30 ENCOUNTER — Telehealth (INDEPENDENT_AMBULATORY_CARE_PROVIDER_SITE_OTHER): Payer: Medicaid Other | Admitting: Internal Medicine

## 2021-06-30 DIAGNOSIS — N760 Acute vaginitis: Secondary | ICD-10-CM | POA: Diagnosis not present

## 2021-06-30 DIAGNOSIS — B9689 Other specified bacterial agents as the cause of diseases classified elsewhere: Secondary | ICD-10-CM

## 2021-06-30 MED ORDER — METRONIDAZOLE 500 MG PO TABS: 500.0000 mg | ORAL_TABLET | Freq: Two times a day (BID) | ORAL | 0 refills | Status: AC

## 2021-06-30 NOTE — Progress Notes (Signed)
Virtual Visit via Video Note  I connected with Dawn Skinner on 06/30/21 at 11:20 AM EST by a video enabled telemedicine application and verified that I am speaking with the correct person using two identifiers.  Location: Patient: Work Provider: Office  Persons participating in this video call: Nicki Reaper, NP and Nicolette Bang.   I discussed the limitations of evaluation and management by telemedicine and the availability of in person appointments. The patient expressed understanding and agreed to proceed.  History of Present Illness:  Patient reports vaginal discharge.  She reports this started about a week ago.  The discharge is white in color with a slight odor.  She denies pelvic pain, colored discharge, vaginal itching, abnormal vaginal bleeding or urinary symptoms.  She has not tried anything OTC for this.  She has a history of BV and would like treatment for this today.  Past Medical History:  Diagnosis Date   Frequent headaches    Hypertension    Iron deficiency anemia     Current Outpatient Medications  Medication Sig Dispense Refill   clobetasol cream (TEMOVATE) 0.05 % Apply 1 application topically 2 (two) times daily. 60 g 1   hydrocortisone cream 0.5 % Apply 1 application topically 2 (two) times daily.     Current Facility-Administered Medications  Medication Dose Route Frequency Provider Last Rate Last Admin   medroxyPROGESTERone (DEPO-PROVERA) injection 150 mg  150 mg Intramuscular Once Francine Hannan, Salvadore Oxford, NP        No Known Allergies  Family History  Problem Relation Age of Onset   Hypertension Mother    Stroke Mother    Diabetes Mellitus II Mother    Hypertension Father    Diabetes Father    Colon polyps Maternal Grandfather    Diabetes Paternal Grandmother     Social History   Socioeconomic History   Marital status: Single    Spouse name: Not on file   Number of children: Not on file   Years of education: Not on file   Highest education level:  Not on file  Occupational History   Not on file  Tobacco Use   Smoking status: Never   Smokeless tobacco: Never  Vaping Use   Vaping Use: Never used  Substance and Sexual Activity   Alcohol use: Yes    Alcohol/week: 3.0 standard drinks    Types: 3 Glasses of wine per week    Comment: 2-3 weekly    Drug use: No   Sexual activity: Yes    Partners: Male    Birth control/protection: Injection, Condom  Other Topics Concern   Not on file  Social History Narrative   Not on file   Social Determinants of Health   Financial Resource Strain: Not on file  Food Insecurity: Not on file  Transportation Needs: Not on file  Physical Activity: Not on file  Stress: Not on file  Social Connections: Not on file  Intimate Partner Violence: Not on file     Constitutional: Denies fever, malaise, fatigue, headache or abrupt weight changes.  Respiratory: Denies difficulty breathing, shortness of breath, cough or sputum production.   Cardiovascular: Denies chest pain, chest tightness, palpitations or swelling in the hands or feet.  Gastrointestinal: Denies abdominal pain, bloating, constipation, diarrhea or blood in the stool.  GU: Patient reports vaginal discharge and odor.  Denies urgency, frequency, pain with urination, burning sensation, blood in urine.   No other specific complaints in a complete review of systems (except as listed in HPI  above).  Observations/Objective:   Wt Readings from Last 3 Encounters:  05/24/21 213 lb 9.6 oz (96.9 kg)  09/30/20 196 lb 9.6 oz (89.2 kg)  08/12/20 200 lb 12.8 oz (91.1 kg)    General: Appears her stated age, in NAD. Pulmonary/Chest: Normal effort. No wheezes, rales or ronchi noted.  Neurological: Alert and oriented.   BMET    Component Value Date/Time   NA 140 06/02/2020 0855   K 4.1 06/02/2020 0855   CL 105 06/02/2020 0855   CO2 29 06/02/2020 0855   GLUCOSE 88 06/02/2020 0855   BUN 8 06/02/2020 0855   CREATININE 0.92 06/02/2020 0855    CALCIUM 9.4 06/02/2020 0855   GFRNONAA 84 06/02/2020 0855   GFRAA 98 06/02/2020 0855    Lipid Panel     Component Value Date/Time   CHOL 160 02/02/2020 0909   TRIG 73 02/02/2020 0909   HDL 45 (L) 02/02/2020 0909   CHOLHDL 3.6 02/02/2020 0909   LDLCALC 99 02/02/2020 0909    CBC    Component Value Date/Time   WBC 9.7 05/02/2020 1914   RBC 4.97 05/02/2020 1914   HGB 13.5 05/02/2020 1914   HCT 41.6 05/02/2020 1914   PLT 206 05/02/2020 1914   MCV 83.7 05/02/2020 1914   MCH 27.2 05/02/2020 1914   MCHC 32.5 05/02/2020 1914   RDW 14.4 05/02/2020 1914   LYMPHSABS 2,267 02/02/2020 0909   MONOABS 0.6 01/07/2020 1503   EOSABS 198 02/02/2020 0909   BASOSABS 40 02/02/2020 0909    Hgb A1C Lab Results  Component Value Date   HGBA1C 5.4 02/02/2020       Assessment and Plan:  Bacterial Vaginosis:  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 The patient was provided an opportunity to ask questions and all were answered. The patient agreed with the plan and demonstrated an understanding of the instructions.   The patient was advised to call back or seek an in-person evaluation if the symptoms worsen or if the condition fails to improve as anticipated.   Nicki Reaper, NP

## 2021-06-30 NOTE — Patient Instructions (Signed)

## 2021-09-05 ENCOUNTER — Ambulatory Visit
Admission: RE | Admit: 2021-09-05 | Discharge: 2021-09-05 | Disposition: A | Discharge status: Home / Self Care | Payer: Medicaid Other | Attending: Internal Medicine | Admitting: Internal Medicine

## 2021-09-05 ENCOUNTER — Ambulatory Visit: Payer: Medicaid Other | Admitting: Internal Medicine

## 2021-09-05 ENCOUNTER — Ambulatory Visit
Admission: RE | Admit: 2021-09-05 | Discharge: 2021-09-05 | Disposition: A | Discharge status: Home / Self Care | Payer: Medicaid Other | Source: Ambulatory Visit | Attending: Internal Medicine | Admitting: Internal Medicine

## 2021-09-05 ENCOUNTER — Encounter: Payer: Self-pay | Admitting: Internal Medicine

## 2021-09-05 ENCOUNTER — Other Ambulatory Visit: Payer: Self-pay | Admitting: Internal Medicine

## 2021-09-05 VITALS — BP 126/88 | HR 77 | Temp 97.8°F | Ht 66.0 in | Wt 207.6 lb

## 2021-09-05 DIAGNOSIS — K5900 Constipation, unspecified: Secondary | ICD-10-CM

## 2021-09-05 DIAGNOSIS — R21 Rash and other nonspecific skin eruption: Secondary | ICD-10-CM

## 2021-09-05 MED ORDER — MUPIROCIN CALCIUM 2 % EX CREA: 1.0000 "application " | TOPICAL_CREAM | Freq: Two times a day (BID) | CUTANEOUS | 0 refills | Status: DC

## 2021-09-05 MED ORDER — LINACLOTIDE 72 MCG PO CAPS: 72.0000 ug | ORAL_CAPSULE | Freq: Every day | ORAL | 0 refills | Status: AC

## 2021-09-05 NOTE — Patient Instructions (Signed)

## 2021-09-05 NOTE — Progress Notes (Signed)
? ?Subjective:  ? ? Patient ID: Dawn Skinner, female    DOB: Apr 27, 1991, 31 y.o.   MRN: [id number] ? ?HPI ? ?Patient presents to clinic today with complaint of constipation.  She reports her last bowel movement was 1 week ago.  She has having some abdominal bloating and cramping.  She reports she has passed some gas but not that much.  She denies nausea, vomiting or severe abdominal pain.  She has had some intermittent issues with constipation in the past but typically resolves when she takes Dulcolax or Mag Citrate.  She reports she has taken those and they have not worked this time. ? ?She also reports persistent rash to her right side of her neck.  This has been an ongoing issue.  She has taken Hydrocortisone and Clobetasol as prescribed with minimal improvement in symptoms.  She has an appointment in June with dermatology. ? ?Review of Systems ? ?   ?Past Medical History:  ?Diagnosis Date  ? Frequent headaches   ? Hypertension   ? Iron deficiency anemia   ? ? ?Current Outpatient Medications  ?Medication Sig Dispense Refill  ? clobetasol cream (TEMOVATE) [id number] % Apply 1 application topically 2 (two) times daily. 60 g 1  ? hydrocortisone cream 0.5 % Apply 1 application topically 2 (two) times daily.    ? ?Current Facility-Administered Medications  ?Medication Dose Route Frequency Provider Last Rate Last Admin  ? medroxyPROGESTERone (DEPO-PROVERA) injection 150 mg  150 mg Intramuscular Once Jearld Fenton, NP      ? ? ?No Known Allergies ? ?Family History  ?Problem Relation Age of Onset  ? Hypertension Mother   ? Stroke Mother   ? Diabetes Mellitus II Mother   ? Hypertension Father   ? Diabetes Father   ? Colon polyps Maternal Grandfather   ? Diabetes Paternal Grandmother   ? ? ?Social History  ? ?Socioeconomic History  ? Marital status: Single  ?  Spouse name: Not on file  ? Number of children: Not on file  ? Years of education: Not on file  ? Highest education level: Not on file  ?Occupational History  ? Not on  file  ?Tobacco Use  ? Smoking status: Never  ? Smokeless tobacco: Never  ?Vaping Use  ? Vaping Use: Never used  ?Substance and Sexual Activity  ? Alcohol use: Yes  ?  Alcohol/week: 3.0 standard drinks  ?  Types: 3 Glasses of wine per week  ?  Comment: 2-3 weekly   ? Drug use: No  ? Sexual activity: Yes  ?  Partners: Male  ?  Birth control/protection: Injection, Condom  ?Other Topics Concern  ? Not on file  ?Social History Narrative  ? Not on file  ? ?Social Determinants of Health  ? ?Financial Resource Strain: Not on file  ?Food Insecurity: Not on file  ?Transportation Needs: Not on file  ?Physical Activity: Not on file  ?Stress: Not on file  ?Social Connections: Not on file  ?Intimate Partner Violence: Not on file  ? ? ? ?Constitutional: Denies fever, malaise, fatigue, headache or abrupt weight changes.  ?Skin: Patient reports rash to right side of neck.  Denies ulcerations. ?Respiratory: Denies difficulty breathing, shortness of breath, cough or sputum production.   ?Cardiovascular: Denies chest pain, chest tightness, palpitations or swelling in the hands or feet.  ?Gastrointestinal: Patient reports abdominal cramping, bloating and constipation.  Denies diarrhea or blood in the stool.  ?Psych: Denies anxiety, depression, SI/HI. ? ?No other specific complaints  in a complete review of systems (except as listed in HPI above). ? ?Objective:  ? Physical Exam ? ?BP 126/88 (BP Location: Right Arm, Patient Position: Sitting, Cuff Size: Normal)   Pulse 77   Temp 97.8 ?F (36.6 ?C) (Temporal)   Ht 5\' 6"  (1.676 m)   Wt 207 lb 9.6 oz (94.2 kg)   SpO2 100%   BMI 33.51 kg/m?  ? ?Wt Readings from Last 3 Encounters:  ?05/24/21 213 lb 9.6 oz (96.9 kg)  ?09/30/20 196 lb 9.6 oz (89.2 kg)  ?08/12/20 200 lb 12.8 oz (91.1 kg)  ? ? ?General: Appears her stated age, obese, in NAD. ?Skin: Grouped, vesicular lesions noted to right side of neck ?Cardiovascular: Normal rate and rhythm. S1,S2 noted.  No murmur, rubs or gallops noted.   ?Pulmonary/Chest: Normal effort and positive vesicular breath sounds. No respiratory distress. No wheezes, rales or ronchi noted.  ?Abdomen: Soft and nontender.  Hypoactive bowel sounds. No distention or masses noted.  ?Rectal: No stool noted in the rectal vault. ?Musculoskeletal: No difficulty with gait.  ?Neurological: Alert and oriented.  ? ?BMET ?   ?Component Value Date/Time  ? NA 140 06/02/2020 0855  ? K 4.1 06/02/2020 0855  ? CL 105 06/02/2020 0855  ? CO2 29 06/02/2020 0855  ? GLUCOSE 88 06/02/2020 0855  ? BUN 8 06/02/2020 0855  ? CREATININE 0.92 06/02/2020 0855  ? CALCIUM 9.4 06/02/2020 0855  ? GFRNONAA 84 06/02/2020 0855  ? GFRAA 98 06/02/2020 0855  ? ? ?Lipid Panel  ?   ?Component Value Date/Time  ? CHOL 160 02/02/2020 0909  ? TRIG 73 02/02/2020 0909  ? HDL 45 (L) 02/02/2020 0909  ? CHOLHDL 3.6 02/02/2020 0909  ? [place] 99 02/02/2020 0909  ? ? ?CBC ?   ?Component Value Date/Time  ? WBC 9.7 05/02/2020 1914  ? RBC 4.97 05/02/2020 1914  ? HGB 13.5 05/02/2020 1914  ? HCT 41.6 05/02/2020 1914  ? PLT 206 05/02/2020 1914  ? MCV 83.7 05/02/2020 1914  ? MCH 27.2 05/02/2020 1914  ? MCHC 32.5 05/02/2020 1914  ? RDW 14.4 05/02/2020 1914  ? LYMPHSABS 2,267 02/02/2020 0909  ? MONOABS 0.6 01/07/2020 1503  ? EOSABS 198 02/02/2020 0909  ? BASOSABS 40 02/02/2020 0909  ? ? ?Hgb A1C ?Lab Results  ?Component Value Date  ? HGBA1C 5.4 02/02/2020  ? ? ? ? ? ? ? ?   ?Assessment & Plan:  ? ?Constipation: ? ?KUB today ?Encouraged her to try Fleets enema, repeat x1 if no results within an hour ?Rx for Linzess 72 mcg daily as needed for constipation ?Encouraged high-fiber diet and adequate water intake ? ?Rash: ? ?No improvement with steroids ?Rx for Bactroban 2 x daily ?Follow-up with dermatology as previously referred ? ?Schedule appointment for annual exam ? ?Webb Silversmith, NP ? ? ?

## 2021-09-07 NOTE — Telephone Encounter (Signed)
Requested medication (s) are due for refill today: Yes ? ?Requested medication (s) are on the active medication list:Yes ? ?Last refill:  09/05/21 ? ?Future visit scheduled: Yes ? ?Notes to clinic:  Pharmacy comment: Alternative Requested:CAN YOU SEND FOR OINTMENT? CREAM IS NOT COVERED. ? ? ? ? ?Requested Prescriptions  ?Pending Prescriptions Disp Refills  ? mupirocin cream (BACTROBAN) 2 % [Pharmacy Med Name: MUPIROCIN 2% CREAM] 30 g 0  ?  Sig: Apply 1 application. topically 2 (two) times daily.  ?  ? Off-Protocol Failed - 09/05/2021  2:00 PM  ?  ?  Failed - Medication not assigned to a protocol, review manually.  ?  ?  Passed - Valid encounter within last 12 months  ?  Recent Outpatient Visits   ? ?      ? 2 days ago Constipation, unspecified constipation type  ? [facility] [place], [place] W, NP  ? 2 months ago Bacterial vaginosis  ? [facility] [place], [place] W, NP  ? 3 months ago Psoriasis  ? [facility] [place], [place], NP  ? 11 months ago Primary hypertension  ? [facility] [place], [place], NP  ? 1 year ago Vaginal discharge  ? [facility] [place], [place] M, [place]  ? ?  ?  ?Future Appointments   ? ?        ? In 1 month Deirdre Evener, MD [facility]  ? ?  ? ? ?  ?  ?  ? ? ? ? ?

## 2021-10-18 ENCOUNTER — Ambulatory Visit (INDEPENDENT_AMBULATORY_CARE_PROVIDER_SITE_OTHER): Payer: Medicaid Other | Admitting: Internal Medicine

## 2021-10-18 ENCOUNTER — Other Ambulatory Visit (HOSPITAL_COMMUNITY)
Admission: RE | Admit: 2021-10-18 | Discharge: 2021-10-18 | Disposition: A | Discharge status: Home / Self Care | Payer: Medicaid Other | Source: Ambulatory Visit | Attending: Internal Medicine | Admitting: Internal Medicine

## 2021-10-18 ENCOUNTER — Encounter: Payer: Self-pay | Admitting: Internal Medicine

## 2021-10-18 VITALS — BP 144/101 | HR 75 | Temp 96.9°F | Ht 66.0 in | Wt 199.0 lb

## 2021-10-18 DIAGNOSIS — Z6832 Body mass index (BMI) 32.0-32.9, adult: Secondary | ICD-10-CM

## 2021-10-18 DIAGNOSIS — I10 Essential (primary) hypertension: Secondary | ICD-10-CM

## 2021-10-18 DIAGNOSIS — Z202 Contact with and (suspected) exposure to infections with a predominantly sexual mode of transmission: Secondary | ICD-10-CM | POA: Diagnosis not present

## 2021-10-18 DIAGNOSIS — Z113 Encounter for screening for infections with a predominantly sexual mode of transmission: Secondary | ICD-10-CM | POA: Diagnosis not present

## 2021-10-18 DIAGNOSIS — E785 Hyperlipidemia, unspecified: Secondary | ICD-10-CM | POA: Diagnosis not present

## 2021-10-18 DIAGNOSIS — R7309 Other abnormal glucose: Secondary | ICD-10-CM | POA: Diagnosis not present

## 2021-10-18 DIAGNOSIS — Z0001 Encounter for general adult medical examination with abnormal findings: Secondary | ICD-10-CM | POA: Diagnosis not present

## 2021-10-18 DIAGNOSIS — E6609 Other obesity due to excess calories: Secondary | ICD-10-CM | POA: Insufficient documentation

## 2021-10-18 MED ORDER — AMLODIPINE BESY-BENAZEPRIL HCL 5-10 MG PO CAPS: 1.0000 | ORAL_CAPSULE | Freq: Every day | ORAL | 0 refills | Status: DC

## 2021-10-18 NOTE — Progress Notes (Signed)
Subjective:    Patient ID: Dawn Skinner, female    DOB: 1990/12/21, 31 y.o.   MRN: [id number]  HPI  Patient presents to clinic today for her annual exam.  She has had exposure to trichomonas and would like STD screening today  Of note, her BP today is 164/102.  She is not currently on any antihypertensive medication but has been in the past.  Flu: never Tetanus: > 10 years ago COVID: never Pap smear: 05/2019 Dentist: biannually  Diet: She does eat meat. She consumes fruits and veggies. She does eat some fried foods. She drinks mostly soda, water, juice. Exercise: Lift weights, cardio, walking  Review of Systems  Past Medical History:  Diagnosis Date   Frequent headaches    Hypertension    Iron deficiency anemia     Current Outpatient Medications  Medication Sig Dispense Refill   linaclotide (LINZESS) 72 MCG capsule Take 1 capsule (72 mcg total) by mouth daily before breakfast. 30 capsule 0   mupirocin ointment (BACTROBAN) 2 % Apply 1 application. topically 2 (two) times daily. 22 g 0   No current facility-administered medications for this visit.    No Known Allergies  Family History  Problem Relation Age of Onset   Hypertension Mother    Stroke Mother    Diabetes Mellitus II Mother    Hypertension Father    Diabetes Father    Colon polyps Maternal Grandfather    Diabetes Paternal Grandmother     Social History   Socioeconomic History   Marital status: Single    Spouse name: Not on file   Number of children: Not on file   Years of education: Not on file   Highest education level: Not on file  Occupational History   Not on file  Tobacco Use   Smoking status: Never   Smokeless tobacco: Never  Vaping Use   Vaping Use: Never used  Substance and Sexual Activity   Alcohol use: Yes    Alcohol/week: 3.0 standard drinks    Types: 3 Glasses of wine per week    Comment: 2-3 weekly    Drug use: No   Sexual activity: Yes    Partners: Male    Birth  control/protection: Injection, Condom  Other Topics Concern   Not on file  Social History Narrative   Not on file   Social Determinants of Health   Financial Resource Strain: Not on file  Food Insecurity: Not on file  Transportation Needs: Not on file  Physical Activity: Not on file  Stress: Not on file  Social Connections: Not on file  Intimate Partner Violence: Not on file     Constitutional: Denies fever, malaise, fatigue, headache or abrupt weight changes.  HEENT: Denies eye pain, eye redness, ear pain, ringing in the ears, wax buildup, runny nose, nasal congestion, bloody nose, or sore throat. Respiratory: Denies difficulty breathing, shortness of breath, cough or sputum production.   Cardiovascular: Denies chest pain, chest tightness, palpitations or swelling in the hands or feet.  Gastrointestinal: Patient reports ongoing constipation.  Denies abdominal pain, bloating, diarrhea or blood in the stool.  GU: Denies urgency, frequency, pain with urination, burning sensation, blood in urine, odor or discharge. Musculoskeletal: Denies decrease in range of motion, difficulty with gait, muscle pain or joint pain and swelling.  Skin: Patient reports rash of neck.  Denies lesions or ulcercations.  Neurological: Patient reports insomnia.  Denies dizziness, difficulty with memory, difficulty with speech or problems with balance and coordination.  Psych: Patient has a history of anxiety.  Denies depression, SI/HI.  No other specific complaints in a complete review of systems (except as listed in HPI above).     Objective:   Physical Exam  BP (!) 164/102 (BP Location: Right Arm, Patient Position: Sitting, Cuff Size: Normal)   Pulse 75   Temp (!) 96.9 F (36.1 C) (Temporal)   Ht 5' 6" (1.676 m)   Wt 199 lb (90.3 kg)   SpO2 99%   BMI 32.12 kg/m   Wt Readings from Last 3 Encounters:  09/05/21 207 lb 9.6 oz (94.2 kg)  05/24/21 213 lb 9.6 oz (96.9 kg)  09/30/20 196 lb 9.6 oz (89.2  kg)    General: Appears her stated age, obese, in NAD. Skin: Warm, dry and intact.  Grouped hyperpigmented maculopapular rash noted to neck. HEENT: Head: normal shape and size; Eyes: sclera white, no icterus, conjunctiva pink, PERRLA and EOMs intact;  Neck:  Neck supple, trachea midline. No masses, lumps or thyromegaly present.  Cardiovascular: Normal rate and rhythm. S1,S2 noted.  No murmur, rubs or gallops noted. No JVD or BLE edema.  Pulmonary/Chest: Normal effort and positive vesicular breath sounds. No respiratory distress. No wheezes, rales or ronchi noted.  Abdomen: Soft and nontender. Normal bowel sounds.  Musculoskeletal: Strength 5/5 BUE/BLE.  No difficulty with gait.  Neurological: Alert and oriented. Cranial nerves II-XII grossly intact. Coordination normal.  Psychiatric: Mood and affect normal. Behavior is normal. Judgment and thought content normal.    BMET    Component Value Date/Time   NA 140 06/02/2020 0855   K 4.1 06/02/2020 0855   CL 105 06/02/2020 0855   CO2 29 06/02/2020 0855   GLUCOSE 88 06/02/2020 0855   BUN 8 06/02/2020 0855   CREATININE 0.92 06/02/2020 0855   CALCIUM 9.4 06/02/2020 0855   GFRNONAA 84 06/02/2020 0855   GFRAA 98 06/02/2020 0855    Lipid Panel     Component Value Date/Time   CHOL 160 02/02/2020 0909   TRIG 73 02/02/2020 0909   HDL 45 (L) 02/02/2020 0909   CHOLHDL 3.6 02/02/2020 0909   LDLCALC 99 02/02/2020 0909    CBC    Component Value Date/Time   WBC 9.7 05/02/2020 1914   RBC 4.97 05/02/2020 1914   HGB 13.5 05/02/2020 1914   HCT 41.6 05/02/2020 1914   PLT 206 05/02/2020 1914   MCV 83.7 05/02/2020 1914   MCH 27.2 05/02/2020 1914   MCHC 32.5 05/02/2020 1914   RDW 14.4 05/02/2020 1914   LYMPHSABS 2,267 02/02/2020 0909   MONOABS 0.6 01/07/2020 1503   EOSABS 198 02/02/2020 0909   BASOSABS 40 02/02/2020 0909    Hgb A1C Lab Results  Component Value Date   HGBA1C 5.4 02/02/2020           Assessment & Plan:    Preventative Health Maintenance, Screen for STD, Exposure to trichomonas:  Encouraged her to get a flu shot in the fall She declines tetanus today Encouraged her to get a COVID-vaccine Pap smear UTD Encouraged her to consume a balanced diet and exercise regimen Advised her to see a dentist annually We will check CBC, c-Met, lipid, A1c, HIV, RPR, hep C, urine gonorrhea, chlamydia and trichomonas   RTC in 2 weeks for follow-up of HTN, 6 months, follow-up chronic conditions Webb Silversmith, NP

## 2021-10-18 NOTE — Patient Instructions (Signed)

## 2021-10-18 NOTE — Assessment & Plan Note (Addendum)
Elevated today, manual repeat 144/101 RX for Amlodipine-Benazepril 5-10 mg daily Reinforced DASH diet and exercise for weight loss

## 2021-10-18 NOTE — Assessment & Plan Note (Signed)
Encourage diet and exercise for weight loss 

## 2021-10-19 LAB — COMPLETE METABOLIC PANEL WITH GFR
AG Ratio: 1.9 (calc) (ref 1.0–2.5)
ALT: 6 U/L (ref 6–29)
AST: 13 U/L (ref 10–30)
Albumin: 4.1 g/dL (ref 3.6–5.1)
Alkaline phosphatase (APISO): 41 U/L (ref 31–125)
BUN: 7 mg/dL (ref 7–25)
CO2: 25 mmol/L (ref 20–32)
Calcium: 8.7 mg/dL (ref 8.6–10.2)
Chloride: 108 mmol/L (ref 98–110)
Creat: 0.89 mg/dL (ref 0.50–0.97)
Globulin: 2.2 g/dL (calc) (ref 1.9–3.7)
Glucose, Bld: 93 mg/dL (ref 65–99)
Potassium: 3.8 mmol/L (ref 3.5–5.3)
Sodium: 140 mmol/L (ref 135–146)
Total Bilirubin: 0.5 mg/dL (ref 0.2–1.2)
Total Protein: 6.3 g/dL (ref 6.1–8.1)
eGFR: 89 mL/min/{1.73_m2} (ref >=60)

## 2021-10-19 LAB — CBC
HCT: 40.9 % (ref 35.0–45.0)
Hemoglobin: 13.2 g/dL (ref 11.7–15.5)
MCH: 27.3 pg (ref 27.0–33.0)
MCHC: 32.3 g/dL (ref 32.0–36.0)
MCV: 84.5 fL (ref 80.0–100.0)
MPV: 11.4 fL (ref 7.5–12.5)
Platelets: 250 10*3/uL (ref 140–400)
RBC: 4.84 10*6/uL (ref 3.80–5.10)
RDW: 13.8 % (ref 11.0–15.0)
WBC: 7.5 10*3/uL (ref 3.8–10.8)

## 2021-10-19 LAB — LIPID PANEL
Cholesterol: 163 mg/dL (ref <200)
HDL: 45 mg/dL — ABNORMAL LOW (ref >=50)
LDL Cholesterol (Calc): 104 mg/dL (calc) — ABNORMAL HIGH
Non-HDL Cholesterol (Calc): 118 mg/dL (calc) (ref <130)
Total CHOL/HDL Ratio: 3.6 (calc) (ref <5.0)
Triglycerides: 55 mg/dL (ref <150)

## 2021-10-19 LAB — HEMOGLOBIN A1C
Hgb A1c MFr Bld: 5.4 % of total Hgb (ref <5.7)
Mean Plasma Glucose: 108 mg/dL
eAG (mmol/L): 6 mmol/L

## 2021-10-19 LAB — HEPATITIS C ANTIBODY
Hepatitis C Ab: NONREACTIVE
SIGNAL TO CUT-OFF: 0.14 (ref <1.00)

## 2021-10-19 LAB — HIV ANTIBODY (ROUTINE TESTING W REFLEX): HIV 1&2 Ab, 4th Generation: NONREACTIVE

## 2021-10-19 LAB — RPR: RPR Ser Ql: NONREACTIVE

## 2021-10-20 LAB — CERVICOVAGINAL ANCILLARY ONLY
Bacterial Vaginitis (gardnerella): POSITIVE — AB
Candida Glabrata: NEGATIVE
Candida Vaginitis: NEGATIVE
Chlamydia: NEGATIVE
Comment: NEGATIVE
Comment: NEGATIVE
Comment: NEGATIVE
Comment: NEGATIVE
Comment: NEGATIVE
Comment: NORMAL
Neisseria Gonorrhea: NEGATIVE
Trichomonas: NEGATIVE

## 2021-10-20 MED ORDER — METRONIDAZOLE 500 MG PO TABS: 500.0000 mg | ORAL_TABLET | Freq: Two times a day (BID) | ORAL | 0 refills | Status: DC

## 2021-10-20 NOTE — Addendum Note (Signed)
Addended by: Lorre Munroe on: 10/20/2021 02:50 PM   Modules accepted: Orders

## 2021-11-03 ENCOUNTER — Ambulatory Visit (INDEPENDENT_AMBULATORY_CARE_PROVIDER_SITE_OTHER): Payer: Medicaid Other | Admitting: Dermatology

## 2021-11-03 DIAGNOSIS — B360 Pityriasis versicolor: Secondary | ICD-10-CM | POA: Diagnosis not present

## 2021-11-03 DIAGNOSIS — R21 Rash and other nonspecific skin eruption: Secondary | ICD-10-CM | POA: Diagnosis not present

## 2021-11-03 DIAGNOSIS — L2081 Atopic neurodermatitis: Secondary | ICD-10-CM | POA: Diagnosis not present

## 2021-11-03 MED ORDER — KETOCONAZOLE 2 % EX SHAM: 1.0000 | MEDICATED_SHAMPOO | Freq: Once | CUTANEOUS | 6 refills | Status: AC

## 2021-11-03 MED ORDER — OPZELURA 1.5 % EX CREA: 1.0000 | TOPICAL_CREAM | Freq: Every morning | CUTANEOUS | 2 refills | Status: DC

## 2021-11-03 MED ORDER — EUCRISA 2 % EX OINT: 1.0000 | TOPICAL_OINTMENT | Freq: Every day | CUTANEOUS | 2 refills | Status: DC

## 2021-11-03 NOTE — Progress Notes (Signed)
   New Patient Visit  Subjective  Dawn Skinner is a 31 y.o. female who presents for the following: Rash (R neck, ~69yr, gets worse in summer, itchy, hydrocortisone cream, and mupirocin in past,  most recent TMC cream most recent using, hx of having same rash on chest but has cleared).  New patient referral from Nicki Reaper, NP.  The following portions of the chart were reviewed this encounter and updated as appropriate:   Tobacco  Allergies  Meds  Problems  Med Hx  Surg Hx  Fam Hx     Review of Systems:  No other skin or systemic complaints except as noted in HPI or Assessment and Plan.  Objective  Well appearing patient in no apparent distress; mood and affect are within normal limits.  A focused examination was performed including right neck. Relevant physical exam findings are noted in the Assessment and Plan.  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 It is often associated with allergic rhinitis and/or asthma and can require treatment with topical medications, phototherapy, or in severe cases biologic injectable medication (Dupixent; Adbry) or Oral JAK inhibitors.   Chronic and persistent condition with duration or expected duration over one year. Condition is symptomatic / bothersome to patient. Not to goal.   D/c TMC cream Start Opzelura cream qd, sample x 1 Lot [id number], exp 12/03/22 Start Eucrisa oint qd, samples x 4 TGBA, exp 12/2023  Ruxolitinib Phosphate (OPZELURA) 1.5 % CREA - R and L neck Apply 1 Application topically every morning. Qam to rash on neck  Crisaborole (EUCRISA) 2 % OINT - R and L neck Apply 1 Application topically at bedtime. Qhs to aa  rash on neck  Tinea versicolor back See photo Tinea versicolor is a chronic recurrent skin rash causing discolored scaly spots most commonly seen on back, chest, and/or shoulders.  It is generally asymptomatic. The rash is due to overgrowth of a common type of yeast present on everyone's skin and it is not contagious.  It tends to flare more in the summer due to increased sweating on trunk.  After rash is treated, the scaliness will resolve, but the discoloration will take longer to return to normal pigmentation. The periodic use of an OTC medicated soap/shampoo with zinc or selenium sulfide can be helpful to prevent yeast overgrowth and recurrence.  Start Keto 2% shampoo from waist up 2-3x/wk, let sit 5 minutes and rinse off, do this for 2 months, then decrease to 1x/month  ketoconazole (NIZORAL) 2 % shampoo - back Apply 1 Application topically once for 1 dose. Wash from waist up 2-3 times a week, let sit on body for 5 minutes and rinse off, do this for 2 months then decrease to 1 time a month for maintenance  Return in about 6 weeks (around 12/15/2021) for Atopic Derm, tinea f/u.  I, Ardis Rowan, RMA, am acting as scribe for Armida Sans, MD . Documentation: I have reviewed the above documentation for accuracy and completeness, and I agree with the above.  Armida Sans, MD

## 2021-11-03 NOTE — Patient Instructions (Addendum)
Eucrisa ointment apply to neck once a day Opzelura cream apply to neck once a day   Due to recent changes in healthcare laws, you may see results of your pathology and/or laboratory studies on MyChart before the doctors have had a chance to review them. We understand that in some cases there may be results that are confusing or concerning to you. Please understand that not all results are received at the same time and often the doctors may need to interpret multiple results in order to provide you with the best plan of care or course of treatment. Therefore, we ask that you please give [place] 2 business days to thoroughly review all your results before contacting the office for clarification. Should we see a critical lab result, you will be contacted sooner.   If You Need Anything After Your Visit  If you have any questions or concerns for your doctor, please call our main line at [phone] and press option 4 to reach your doctor's medical assistant. If no one answers, please leave a voicemail as directed and we will return your call as soon as possible. Messages left after 4 pm will be answered the following business day.   You may also send [place] a message via MyChart. We typically respond to MyChart messages within 1-2 business days.  For prescription refills, please ask your pharmacy to contact our office. Our fax number is [phone].  If you have an urgent issue when the clinic is closed that cannot wait until the next business day, you can page your doctor at the number below.    Please note that while we do our best to be available for urgent issues outside of office hours, we are not available 24/7.   If you have an urgent issue and are unable to reach [place], you may choose to seek medical care at your doctor's office, retail clinic, urgent care center, or emergency room.  If you have a medical emergency, please immediately call 911 or go to the emergency department.  Pager Numbers  -  Dr. Gwen Pounds: [phone]  - Dr. Neale Burly: [phone]  - Dr. Roseanne Reno: [phone]  In the event of inclement weather, please call our main line at [phone] for an update on the status of any delays or closures.  Dermatology Medication Tips: Please keep the boxes that topical medications come in in order to help keep track of the instructions about where and how to use these. Pharmacies typically print the medication instructions only on the boxes and not directly on the medication tubes.   If your medication is too expensive, please contact our office at [phone] option 4 or send [place] a message through MyChart.   We are unable to tell what your co-pay for medications will be in advance as this is different depending on your insurance coverage. However, we may be able to find a substitute medication at lower cost or fill out paperwork to get insurance to cover a needed medication.   If a prior authorization is required to get your medication covered by your insurance company, please allow [place] 1-2 business days to complete this process.  Drug prices often vary depending on where the prescription is filled and some pharmacies may offer cheaper prices.  The website www.goodrx.com contains coupons for medications through different pharmacies. The prices here do not account for what the cost may be with help from insurance (it may be cheaper with your insurance), but the website can give you the price if  you did not use any insurance.  - You can print the associated coupon and take it with your prescription to the pharmacy.  - You may also stop by our office during regular business hours and pick up a GoodRx coupon card.  - If you need your prescription sent electronically to a different pharmacy, notify our office through [facility] or by phone at [phone] option 4.     Si Usted Necesita Algo Despus de Su Visita  Tambin puede enviarnos un mensaje a travs de Clinical cytogeneticist. Por  lo general respondemos a los mensajes de MyChart en el transcurso de 1 a 2 das hbiles.  Para renovar recetas, por favor pida a su farmacia que se ponga en contacto con nuestra oficina. Annie Sable de fax es [place] [phone].  Si tiene un asunto urgente cuando la clnica est cerrada y que no puede esperar hasta el siguiente da hbil, puede llamar/localizar a su doctor(a) al nmero que aparece a continuacin.   Por favor, tenga en cuenta que aunque hacemos todo lo posible para estar disponibles para asuntos urgentes fuera del horario de [place], no estamos disponibles las 24 horas del da, los 7 [address]  [address] de la [place].   Si tiene un problema urgente y no puede comunicarse con nosotros, puede optar por buscar atencin mdica  en el consultorio de su doctor(a), en una clnica privada, en un centro de atencin urgente o en una sala de emergencias.  Si tiene Engineer, drilling, por favor llame inmediatamente al 911 o vaya a la sala de emergencias.  Nmeros de bper  - Dr. Gwen Pounds: [phone]  - Dra. Moye: [phone]  - Dra. Roseanne Reno: [phone]  En caso de inclemencias del [place], por favor llame a Lacy Duverney principal al [phone] para una actualizacin sobre el [place] de cualquier retraso o cierre.  Consejos para la medicacin en dermatologa: Por favor, guarde las cajas en las que vienen los medicamentos de uso tpico para ayudarle a seguir las instrucciones sobre dnde y cmo usarlos. Las farmacias generalmente imprimen las instrucciones del medicamento slo en las cajas y no directamente en los tubos del [place].   Si su medicamento es muy caro, por favor, pngase en contacto con Rolm Gala llamando al [phone] y presione la opcin 4 o envenos un mensaje a travs de Clinical cytogeneticist.   No podemos decirle cul ser su copago por los medicamentos por adelantado ya que esto es diferente dependiendo de la cobertura de su seguro. Sin embargo, es posible que podamos encontrar  un medicamento sustituto a Audiological scientist un formulario para que el seguro cubra el medicamento que se considera necesario.   Si se requiere una autorizacin previa para que su compaa de seguros [place] su medicamento, por favor permtanos de 1 a 2 das hbiles para completar [address]  Los precios de los medicamentos varan con frecuencia dependiendo del Environmental consultant de dnde se surte la receta y alguna farmacias pueden ofrecer precios ms baratos.  El sitio web www.goodrx.com tiene cupones para medicamentos de Health and safety inspector. Los precios aqu no tienen en cuenta lo que podra costar con la ayuda del seguro (puede ser ms barato con su seguro), pero el sitio web puede darle el precio si no utiliz Tourist information centre manager.  - Puede imprimir el cupn correspondiente y llevarlo con su receta a la farmacia.  - Tambin puede pasar por nuestra oficina durante el horario de atencin regular y Education officer, museum una tarjeta de cupones de GoodRx.  - Si necesita que su receta  se enve electrnicamente a una farmacia diferente, informe a nuestra oficina a travs de MyChart de [place] o por telfono llamando al [phone] y presione la opcin 4.

## 2021-11-22 ENCOUNTER — Encounter: Payer: Self-pay | Admitting: Dermatology

## 2021-11-23 DIAGNOSIS — H5213 Myopia, bilateral: Secondary | ICD-10-CM | POA: Diagnosis not present

## 2021-12-05 ENCOUNTER — Encounter: Payer: Self-pay | Admitting: Internal Medicine

## 2021-12-05 ENCOUNTER — Ambulatory Visit (INDEPENDENT_AMBULATORY_CARE_PROVIDER_SITE_OTHER): Payer: Medicaid Other | Admitting: Internal Medicine

## 2021-12-05 VITALS — BP 151/99 | HR 76 | Ht 66.0 in | Wt 201.0 lb

## 2021-12-05 DIAGNOSIS — E6609 Other obesity due to excess calories: Secondary | ICD-10-CM

## 2021-12-05 DIAGNOSIS — I10 Essential (primary) hypertension: Secondary | ICD-10-CM

## 2021-12-05 DIAGNOSIS — Z6832 Body mass index (BMI) 32.0-32.9, adult: Secondary | ICD-10-CM

## 2021-12-05 MED ORDER — AMLODIPINE BESY-BENAZEPRIL HCL 10-20 MG PO CAPS: 1.0000 | ORAL_CAPSULE | Freq: Every day | ORAL | 0 refills | Status: AC

## 2021-12-05 NOTE — Assessment & Plan Note (Signed)
Encourage diet and exercise for weight loss 

## 2021-12-05 NOTE — Progress Notes (Signed)
Subjective:    Patient ID: Dawn Skinner, female    DOB: 01/05/1991, 31 y.o.   MRN: [id number]  HPI  Patient presents to clinic today for 2-week follow-up of HTN.  At her last visit she was started on Amlodipine-Benazepril.  She has been taking medication as prescribed.  Her BP today is 151/99.  Review of Systems     Past Medical History:  Diagnosis Date   Frequent headaches    Hypertension    Iron deficiency anemia     Current Outpatient Medications  Medication Sig Dispense Refill   amLODipine-benazepril (LOTREL) 5-10 MG capsule Take 1 capsule by mouth daily. 30 capsule 0   Crisaborole (EUCRISA) 2 % OINT Apply 1 Application topically at bedtime. Qhs to aa rash on neck 100 g 2   linaclotide (LINZESS) 72 MCG capsule Take 1 capsule (72 mcg total) by mouth daily before breakfast. 30 capsule 0   metroNIDAZOLE (FLAGYL) 500 MG tablet Take 1 tablet (500 mg total) by mouth 2 (two) times daily. Do not drink alcohol while taking this medicine. 14 tablet 0   mupirocin ointment (BACTROBAN) 2 % Apply 1 application. topically 2 (two) times daily. 22 g 0   Ruxolitinib Phosphate (OPZELURA) 1.5 % CREA Apply 1 Application topically every morning. Qam to rash on neck 60 g 2   No current facility-administered medications for this visit.    No Known Allergies  Family History  Problem Relation Age of Onset   Hypertension Mother    Stroke Mother    Diabetes Mellitus II Mother    Hypertension Father    Diabetes Father    Colon polyps Maternal Grandfather    Diabetes Paternal Grandmother     Social History   Socioeconomic History   Marital status: Single    Spouse name: Not on file   Number of children: Not on file   Years of education: Not on file   Highest education level: Not on file  Occupational History   Not on file  Tobacco Use   Smoking status: Never   Smokeless tobacco: Never  Vaping Use   Vaping Use: Never used  Substance and Sexual Activity   Alcohol use: Yes     Alcohol/week: 3.0 standard drinks of alcohol    Types: 3 Glasses of wine per week    Comment: 2-3 weekly    Drug use: No   Sexual activity: Yes    Partners: Male    Birth control/protection: Injection, Condom  Other Topics Concern   Not on file  Social History Narrative   Not on file   Social Determinants of Health   Financial Resource Strain: Not on file  Food Insecurity: Not on file  Transportation Needs: Not on file  Physical Activity: Not on file  Stress: Not on file  Social Connections: Not on file  Intimate Partner Violence: Not on file     Constitutional: Denies fever, malaise, fatigue, headache or abrupt weight changes.  Respiratory: Denies difficulty breathing, shortness of breath, cough or sputum production.   Cardiovascular: Denies chest pain, chest tightness, palpitations or swelling in the hands or feet.  Neurological: Denies dizziness, difficulty with memory, difficulty with speech or problems with balance and coordination.    No other specific complaints in a complete review of systems (except as listed in HPI above).  Objective:   Physical Exam  BP (!) 151/99   Pulse 76   Ht 5\' 6"  (1.676 m)   Wt 201 lb (91.2 kg)  SpO2 100%   BMI 32.44 kg/m   Wt Readings from Last 3 Encounters:  10/18/21 199 lb (90.3 kg)  09/05/21 207 lb 9.6 oz (94.2 kg)  05/24/21 213 lb 9.6 oz (96.9 kg)    General: Appears her stated age, obese, in NAD. Cardiovascular: Normal rate and rhythm. S1,S2 noted.  No murmur, rubs or gallops noted. No JVD or BLE edema.  Pulmonary/Chest: Normal effort and positive vesicular breath sounds. No respiratory distress. No wheezes, rales or ronchi noted.  Neurological: Alert and oriented.     BMET    Component Value Date/Time   NA 140 10/18/2021 1347   K 3.8 10/18/2021 1347   CL 108 10/18/2021 1347   CO2 25 10/18/2021 1347   GLUCOSE 93 10/18/2021 1347   BUN 7 10/18/2021 1347   CREATININE 0.89 10/18/2021 1347   CALCIUM 8.7 10/18/2021  1347   GFRNONAA 84 06/02/2020 0855   GFRAA 98 06/02/2020 0855    Lipid Panel     Component Value Date/Time   CHOL 163 10/18/2021 1347   TRIG 55 10/18/2021 1347   HDL 45 (L) 10/18/2021 1347   CHOLHDL 3.6 10/18/2021 1347   LDLCALC 104 (H) 10/18/2021 1347    CBC    Component Value Date/Time   WBC 7.5 10/18/2021 1347   RBC 4.84 10/18/2021 1347   HGB 13.2 10/18/2021 1347   HCT 40.9 10/18/2021 1347   PLT 250 10/18/2021 1347   MCV 84.5 10/18/2021 1347   MCH 27.3 10/18/2021 1347   MCHC 32.3 10/18/2021 1347   RDW 13.8 10/18/2021 1347   LYMPHSABS 2,267 02/02/2020 0909   MONOABS 0.6 01/07/2020 1503   EOSABS 198 02/02/2020 0909   BASOSABS 40 02/02/2020 0909    Hgb A1C Lab Results  Component Value Date   HGBA1C 5.4 10/18/2021           Assessment & Plan:    RTC in 5 months for follow-up of chronic conditions Nicki Reaper, NP

## 2021-12-05 NOTE — Assessment & Plan Note (Signed)
Remains elevated We will increase amlodipine-benazepril to 10-20 mg daily Reinforced DASH diet and exercise for weight loss

## 2021-12-18 IMAGING — CR DG CHEST 2V
2 series · 2 of 2 positions shown · non-contrast
Comparison: 10/13/2018

CLINICAL DATA: Chest pain

EXAM:
CHEST - 2 VIEW

[chest pa]
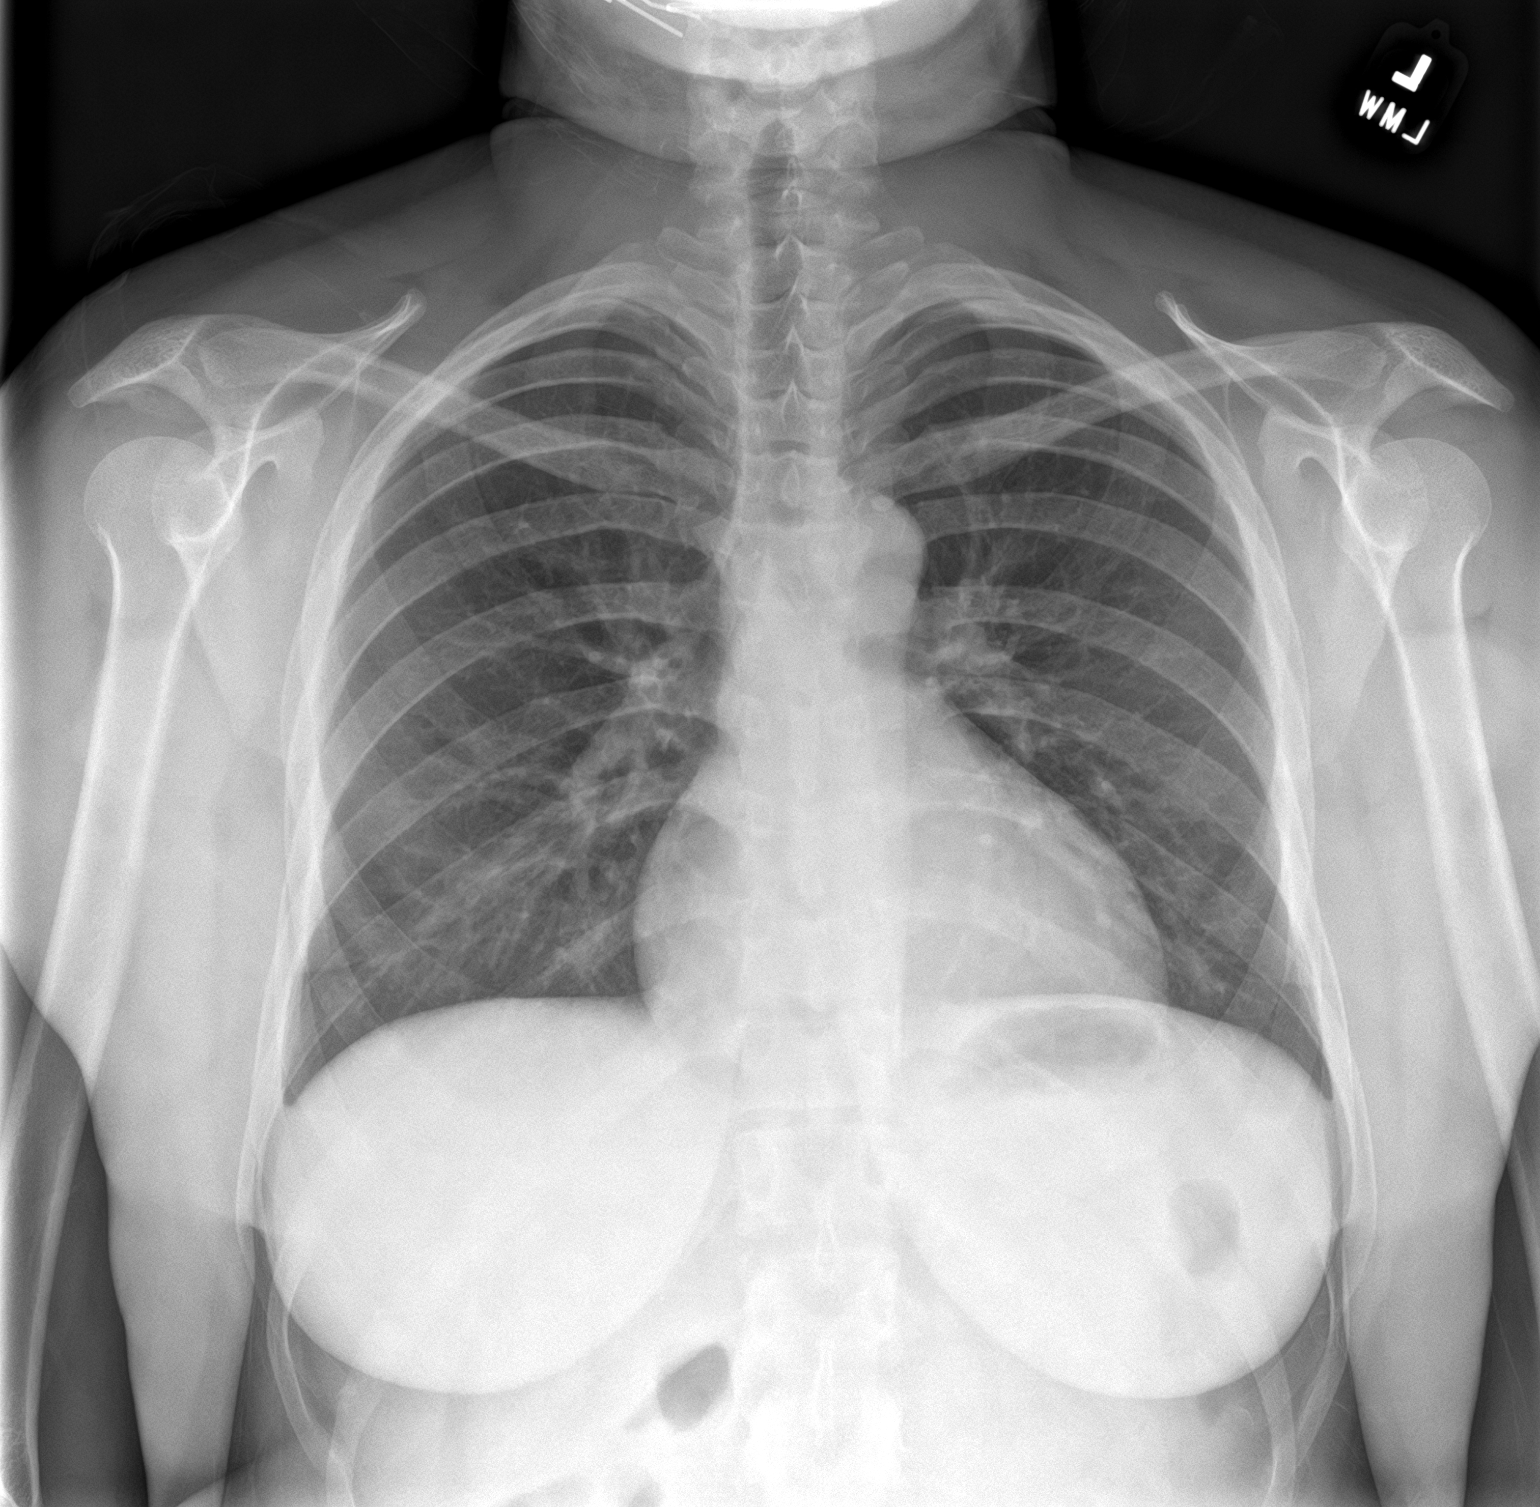

[chest lat]
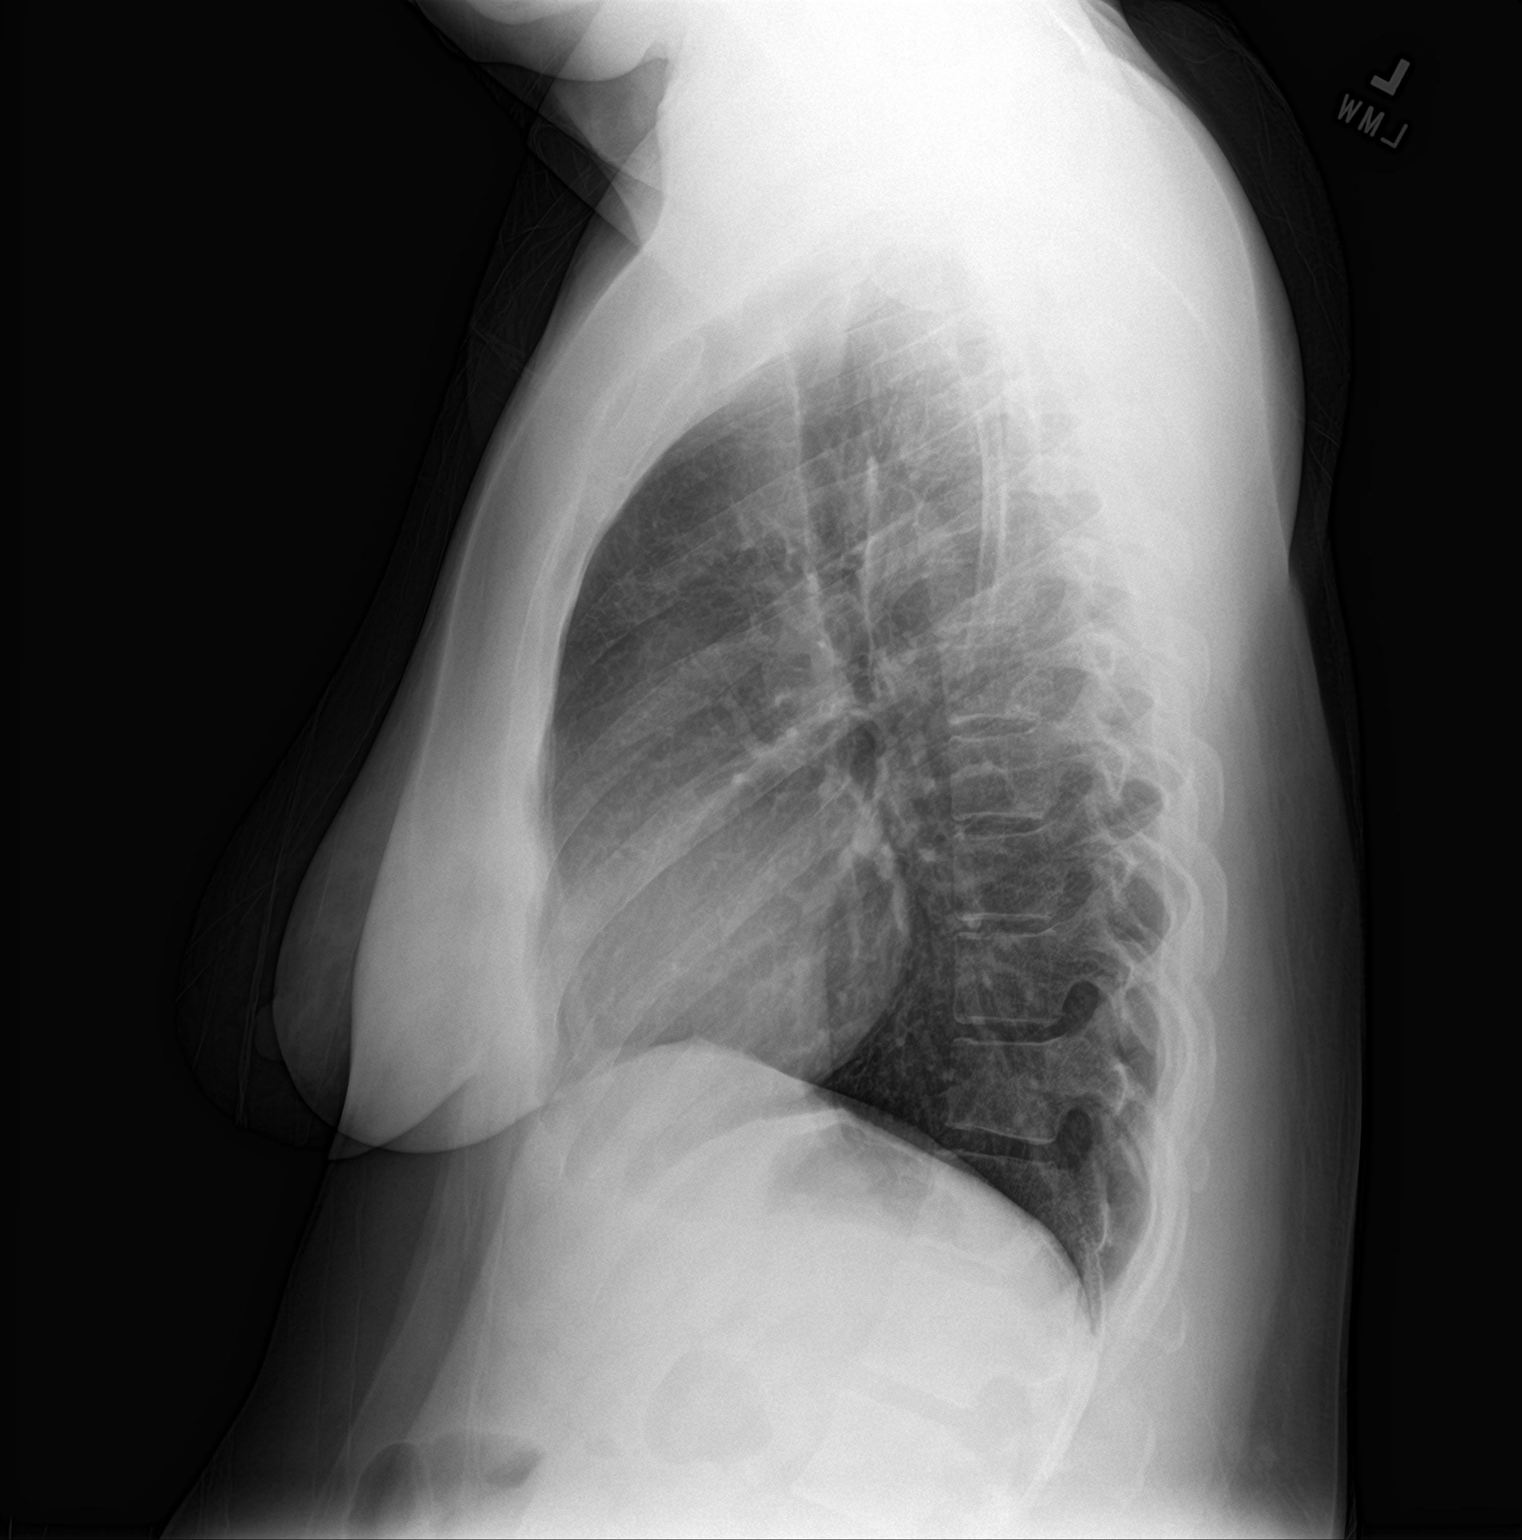

[2 of 2 positions shown; findings below may reference images not displayed]

FINDINGS: The heart size and mediastinal contours are within normal limits.
Both lungs are clear. The visualized skeletal structures are
unremarkable.
IMPRESSION: No active cardiopulmonary disease.

## 2021-12-22 ENCOUNTER — Ambulatory Visit (INDEPENDENT_AMBULATORY_CARE_PROVIDER_SITE_OTHER): Payer: Medicaid Other | Admitting: Dermatology

## 2021-12-22 DIAGNOSIS — L2089 Other atopic dermatitis: Secondary | ICD-10-CM

## 2021-12-22 DIAGNOSIS — L2081 Atopic neurodermatitis: Secondary | ICD-10-CM | POA: Diagnosis not present

## 2021-12-22 DIAGNOSIS — B360 Pityriasis versicolor: Secondary | ICD-10-CM | POA: Diagnosis not present

## 2021-12-22 MED ORDER — EUCRISA 2 % EX OINT: 1.0000 | TOPICAL_OINTMENT | Freq: Every day | CUTANEOUS | 11 refills | Status: AC

## 2021-12-22 MED ORDER — KETOCONAZOLE 2 % EX SHAM: MEDICATED_SHAMPOO | CUTANEOUS | 11 refills | Status: AC

## 2021-12-22 MED ORDER — OPZELURA 1.5 % EX CREA: 1.0000 | TOPICAL_CREAM | Freq: Every morning | CUTANEOUS | 11 refills | Status: AC

## 2021-12-22 NOTE — Patient Instructions (Signed)
Due to recent changes in healthcare laws, you may see results of your pathology and/or laboratory studies on MyChart before the doctors have had a chance to review them. We understand that in some cases there may be results that are confusing or concerning to you. Please understand that not all results are received at the same time and often the doctors may need to interpret multiple results in order to provide you with the best plan of care or course of treatment. Therefore, we ask that you please give us 2 business days to thoroughly review all your results before contacting the office for clarification. Should we see a critical lab result, you will be contacted sooner.   If You Need Anything After Your Visit  If you have any questions or concerns for your doctor, please call our main line at [phone] and press option 4 to reach your doctor's medical assistant. If no one answers, please leave a voicemail as directed and we will return your call as soon as possible. Messages left after 4 pm will be answered the following business day.   You may also send us a message via MyChart. We typically respond to MyChart messages within 1-2 business days.  For prescription refills, please ask your pharmacy to contact our office. Our fax number is [phone].  If you have an urgent issue when the clinic is closed that cannot wait until the next business day, you can page your doctor at the number below.    Please note that while we do our best to be available for urgent issues outside of office hours, we are not available 24/7.   If you have an urgent issue and are unable to reach us, you may choose to seek medical care at your doctor's office, retail clinic, urgent care center, or emergency room.  If you have a medical emergency, please immediately call 911 or go to the emergency department.  Pager Numbers  - Dr. Kowalski: [phone]  - Dr. Moye: [phone]  - Dr. Stewart:  [phone]  In the event of inclement weather, please call our main line at [phone] for an update on the status of any delays or closures.  Dermatology Medication Tips: Please keep the boxes that topical medications come in in order to help keep track of the instructions about where and how to use these. Pharmacies typically print the medication instructions only on the boxes and not directly on the medication tubes.   If your medication is too expensive, please contact our office at [phone] option 4 or send us a message through MyChart.   We are unable to tell what your co-pay for medications will be in advance as this is different depending on your insurance coverage. However, we may be able to find a substitute medication at lower cost or fill out paperwork to get insurance to cover a needed medication.   If a prior authorization is required to get your medication covered by your insurance company, please allow us 1-2 business days to complete this process.  Drug prices often vary depending on where the prescription is filled and some pharmacies may offer cheaper prices.  The website www.goodrx.com contains coupons for medications through different pharmacies. The prices here do not account for what the cost may be with help from insurance (it may be cheaper with your insurance), but the website can give you the price if you did not use any insurance.  - You can print the associated coupon and take it with   your prescription to the pharmacy.  - You may also stop by our office during regular business hours and pick up a GoodRx coupon card.  - If you need your prescription sent electronically to a different pharmacy, notify our office through [place] MyChart or by phone at [phone] option 4.     Si Usted Necesita Algo Despus de Su Visita  Tambin puede enviarnos un mensaje a travs de MyChart. Por lo general respondemos a los mensajes de MyChart en el transcurso de 1 a 2  das hbiles.  Para renovar recetas, por favor pida a su farmacia que se ponga en contacto con nuestra oficina. Nuestro nmero de fax es el [phone].  Si tiene un asunto urgente cuando la clnica est cerrada y que no puede esperar hasta el siguiente da hbil, puede llamar/localizar a su doctor(a) al nmero que aparece a continuacin.   Por favor, tenga en cuenta que aunque hacemos todo lo posible para estar disponibles para asuntos urgentes fuera del horario de oficina, no estamos disponibles las 24 horas del da, los 7 das de la semana.   Si tiene un problema urgente y no puede comunicarse con nosotros, puede optar por buscar atencin mdica  en el consultorio de su doctor(a), en una clnica privada, en un centro de atencin urgente o en una sala de emergencias.  Si tiene una emergencia mdica, por favor llame inmediatamente al 911 o vaya a la sala de emergencias.  Nmeros de bper  - Dr. Kowalski: [phone]  - Dra. Moye: [phone]  - Dra. Stewart: [phone]  En caso de inclemencias del tiempo, por favor llame a nuestra lnea principal al [phone] para una actualizacin sobre el estado de cualquier retraso o cierre.  Consejos para la medicacin en dermatologa: Por favor, guarde las cajas en las que vienen los medicamentos de uso tpico para ayudarle a seguir las instrucciones sobre dnde y cmo usarlos. Las farmacias generalmente imprimen las instrucciones del medicamento slo en las cajas y no directamente en los tubos del medicamento.   Si su medicamento es muy caro, por favor, pngase en contacto con nuestra oficina llamando al [phone] y presione la opcin 4 o envenos un mensaje a travs de MyChart.   No podemos decirle cul ser su copago por los medicamentos por adelantado ya que esto es diferente dependiendo de la cobertura de su seguro. Sin embargo, es posible que podamos encontrar un medicamento sustituto a menor costo o llenar un formulario para que el  seguro cubra el medicamento que se considera necesario.   Si se requiere una autorizacin previa para que su compaa de seguros cubra su medicamento, por favor permtanos de 1 a 2 das hbiles para completar este proceso.  Los precios de los medicamentos varan con frecuencia dependiendo del lugar de dnde se surte la receta y alguna farmacias pueden ofrecer precios ms baratos.  El sitio web www.goodrx.com tiene cupones para medicamentos de diferentes farmacias. Los precios aqu no tienen en cuenta lo que podra costar con la ayuda del seguro (puede ser ms barato con su seguro), pero el sitio web puede darle el precio si no utiliz ningn seguro.  - Puede imprimir el cupn correspondiente y llevarlo con su receta a la farmacia.  - Tambin puede pasar por nuestra oficina durante el horario de atencin regular y recoger una tarjeta de cupones de GoodRx.  - Si necesita que su receta se enve electrnicamente a una farmacia diferente, informe a nuestra oficina a travs de MyChart de [place]   o por telfono llamando al [phone] y presione la opcin 4.  

## 2021-12-22 NOTE — Progress Notes (Signed)
   Follow-Up Visit   Subjective  Dawn Skinner is a 31 y.o. female who presents for the following: Atopic dermatitis (Of the neck - improved since starting Opzelura and Eucrisa ointment) and Tinea versi color (Of the back - patient currently using Ketoconazole 2% shampoo QW).  The following portions of the chart were reviewed this encounter and updated as appropriate:   Tobacco  Allergies  Meds  Problems  Med Hx  Surg Hx  Fam Hx     Review of Systems:  No other skin or systemic complaints except as noted in HPI or Assessment and Plan.  Objective  Well appearing patient in no apparent distress; mood and affect are within normal limits.  A focused examination was performed including the face, neck, and back. Relevant physical exam findings are noted in the Assessment and Plan.  Neck Some lichenification of the R post neck near the hairline.  Back Some spotty hyperpigmentation otherwise clear.    Assessment & Plan  Other atopic dermatitis Neck Chronic and persistent condition with duration or expected duration over one year. Condition is symptomatic / bothersome to patient. Improved by at least 50%.  Atopic dermatitis (eczema) is a chronic, relapsing, pruritic condition that can significantly affect quality of life. It is often associated with allergic rhinitis and/or asthma and can require treatment with topical medications, phototherapy, or in severe cases biologic injectable medication (Dupixent; Adbry) or Oral JAK inhibitors.  Continue Eucrisa QD and Opzelura QD PRN.   Tinea versicolor Back Continue Ketoconazole 2% shampoo QW.   ketoconazole (NIZORAL) 2 % shampoo - Back Use as body wash QW. Let sit 5-10 minutes before washing off.  Return in about 1 year (around 12/23/2022) for AD and TV follow up .  Maylene Roes, CMA, am acting as scribe for Armida Sans, MD . Documentation: I have reviewed the above documentation for accuracy and completeness, and I agree  with the above.  Armida Sans, MD

## 2021-12-27 ENCOUNTER — Encounter: Payer: Self-pay | Admitting: Dermatology

## 2022-01-21 DIAGNOSIS — H52223 Regular astigmatism, bilateral: Secondary | ICD-10-CM | POA: Diagnosis not present

## 2022-01-21 DIAGNOSIS — H5203 Hypermetropia, bilateral: Secondary | ICD-10-CM | POA: Diagnosis not present

## 2022-05-23 ENCOUNTER — Ambulatory Visit: Payer: Medicaid Other | Admitting: Internal Medicine

## 2022-05-23 NOTE — Progress Notes (Deleted)
Subjective:    Patient ID: Dawn Skinner, female    DOB: 12/06/1990, 32 y.o.   MRN: [id number]  HPI  Patient presents to clinic today for follow-up of chronic conditions.  HTN: Her BP today is.  She is taking Amlodipine-Benazepril as prescribed.  ECG from 05/2020 reviewed.  Psoriasis: Managed on [place].  She follows with dermatology.  Anxiety: She is not currently taking any medications for this.  She is not currently seeing a therapist.  She denies depression, SI/HI.  Insomnia: She has difficulty.  She is not currently taking any medications for this.  There is no sleep study on file.  Chronic Constipation: Managed with [facility].  There is no colonoscopy on file.  Review of Systems     Past Medical History:  Diagnosis Date   Frequent headaches    Hypertension    Iron deficiency anemia     Current Outpatient Medications  Medication Sig Dispense Refill   amLODipine-benazepril (LOTREL) 10-20 MG capsule Take 1 capsule by mouth daily. 90 capsule 0   Crisaborole (EUCRISA) 2 % OINT Apply 1 Application topically at bedtime. Qhs to aa rash on neck 100 g 11   ketoconazole (NIZORAL) 2 % shampoo Use as body wash QW. Let sit 5-10 minutes before washing off. 120 mL 11   linaclotide (LINZESS) 72 MCG capsule Take 1 capsule (72 mcg total) by mouth daily before breakfast. 30 capsule 0   Ruxolitinib Phosphate (OPZELURA) 1.5 % CREA Apply 1 Application topically every morning. Qam to rash on neck 60 g 11   No current facility-administered medications for this visit.    No Known Allergies  Family History  Problem Relation Age of Onset   Hypertension Mother    Stroke Mother    Diabetes Mellitus II Mother    Hypertension Father    Diabetes Father    Colon polyps Maternal Grandfather    Diabetes Paternal Grandmother     Social History   Socioeconomic History   Marital status: Single    Spouse name: Not on file   Number of children: Not on file   Years of education:  Not on file   Highest education level: Not on file  Occupational History   Not on file  Tobacco Use   Smoking status: Never   Smokeless tobacco: Never  Vaping Use   Vaping Use: Never used  Substance and Sexual Activity   Alcohol use: Yes    Alcohol/week: 3.0 standard drinks of alcohol    Types: 3 Glasses of wine per week    Comment: 2-3 weekly    Drug use: No   Sexual activity: Yes    Partners: Male    Birth control/protection: Injection, Condom  Other Topics Concern   Not on file  Social History Narrative   Not on file   Social Determinants of Health   Financial Resource Strain: Not on file  Food Insecurity: Not on file  Transportation Needs: Not on file  Physical Activity: Not on file  Stress: Not on file  Social Connections: Not on file  Intimate Partner Violence: Not on file     Constitutional: Denies fever, malaise, fatigue, headache or abrupt weight changes.  HEENT: Denies eye pain, eye redness, ear pain, ringing in the ears, wax buildup, runny nose, nasal congestion, bloody nose, or sore throat. Respiratory: Denies difficulty breathing, shortness of breath, cough or sputum production.   Cardiovascular: Denies chest pain, chest tightness, palpitations or swelling in the hands or feet.  Gastrointestinal: Patient reports constipation.  Denies abdominal pain, bloating, diarrhea or blood in the stool.  GU: Denies urgency, frequency, pain with urination, burning sensation, blood in urine, odor or discharge. Musculoskeletal: Denies decrease in range of motion, difficulty with gait, muscle pain or joint pain and swelling.  Skin: Denies redness, rashes, lesions or ulcercations.  Neurological: Patient reports insomnia.  Denies dizziness, difficulty with memory, difficulty with speech or problems with balance and coordination.  Psych: Patient has a history of anxiety.  Denies depression, SI/HI.  No other specific complaints in a complete review of systems (except as listed  in HPI above).  Objective:   Physical Exam   There were no vitals taken for this visit. Wt Readings from Last 3 Encounters:  12/05/21 201 lb (91.2 kg)  10/18/21 199 lb (90.3 kg)  09/05/21 207 lb 9.6 oz (94.2 kg)    General: Appears their stated age, well developed, well nourished in NAD. Skin: Warm, dry and intact. No rashes, lesions or ulcerations noted. HEENT: Head: normal shape and size; Eyes: sclera white, no icterus, conjunctiva pink, PERRLA and EOMs intact; Ears: Tm's gray and intact, normal light reflex; Nose: mucosa pink and moist, septum midline; Throat/Mouth: Teeth present, mucosa pink and moist, no exudate, lesions or ulcerations noted.  Neck:  Neck supple, trachea midline. No masses, lumps or thyromegaly present.  Cardiovascular: Normal rate and rhythm. S1,S2 noted.  No murmur, rubs or gallops noted. No JVD or BLE edema. No carotid bruits noted. Pulmonary/Chest: Normal effort and positive vesicular breath sounds. No respiratory distress. No wheezes, rales or ronchi noted.  Abdomen: Soft and nontender. Normal bowel sounds. No distention or masses noted. Liver, spleen and kidneys non palpable. Musculoskeletal: Normal range of motion. No signs of joint swelling. No difficulty with gait.  Neurological: Alert and oriented. Cranial nerves II-XII grossly intact. Coordination normal.  Psychiatric: Mood and affect normal. Behavior is normal. Judgment and thought content normal.    BMET    Component Value Date/Time   NA 140 10/18/2021 1347   K 3.8 10/18/2021 1347   CL 108 10/18/2021 1347   CO2 25 10/18/2021 1347   GLUCOSE 93 10/18/2021 1347   BUN 7 10/18/2021 1347   CREATININE 0.89 10/18/2021 1347   CALCIUM 8.7 10/18/2021 1347   GFRNONAA 84 06/02/2020 0855   GFRAA 98 06/02/2020 0855    Lipid Panel     Component Value Date/Time   CHOL 163 10/18/2021 1347   TRIG 55 10/18/2021 1347   HDL 45 (L) 10/18/2021 1347   CHOLHDL 3.6 10/18/2021 1347   LDLCALC 104 (H) 10/18/2021  1347    CBC    Component Value Date/Time   WBC 7.5 10/18/2021 1347   RBC 4.84 10/18/2021 1347   HGB 13.2 10/18/2021 1347   HCT 40.9 10/18/2021 1347   PLT 250 10/18/2021 1347   MCV 84.5 10/18/2021 1347   MCH 27.3 10/18/2021 1347   MCHC 32.3 10/18/2021 1347   RDW 13.8 10/18/2021 1347   LYMPHSABS 2,267 02/02/2020 0909   MONOABS 0.6 01/07/2020 1503   EOSABS 198 02/02/2020 0909   BASOSABS 40 02/02/2020 0909    Hgb A1C Lab Results  Component Value Date   HGBA1C 5.4 10/18/2021           Assessment & Plan:      RTC in 6 months for annual exam Webb Silversmith, NP

## 2022-05-30 ENCOUNTER — Encounter: Payer: Self-pay | Admitting: Internal Medicine

## 2022-05-30 ENCOUNTER — Encounter: Payer: Medicaid Other | Admitting: Internal Medicine

## 2022-05-30 ENCOUNTER — Other Ambulatory Visit (HOSPITAL_COMMUNITY)
Admission: RE | Admit: 2022-05-30 | Discharge: 2022-05-30 | Disposition: A | Discharge status: Home / Self Care | Payer: Medicaid Other | Source: Ambulatory Visit | Attending: Internal Medicine | Admitting: Internal Medicine

## 2022-05-30 ENCOUNTER — Ambulatory Visit (INDEPENDENT_AMBULATORY_CARE_PROVIDER_SITE_OTHER): Payer: Medicaid Other | Admitting: Internal Medicine

## 2022-05-30 VITALS — BP 134/86 | HR 69 | Temp 96.8°F | Wt 190.0 lb

## 2022-05-30 DIAGNOSIS — I10 Essential (primary) hypertension: Secondary | ICD-10-CM

## 2022-05-30 DIAGNOSIS — F419 Anxiety disorder, unspecified: Secondary | ICD-10-CM | POA: Diagnosis not present

## 2022-05-30 DIAGNOSIS — N898 Other specified noninflammatory disorders of vagina: Secondary | ICD-10-CM

## 2022-05-30 DIAGNOSIS — L409 Psoriasis, unspecified: Secondary | ICD-10-CM

## 2022-05-30 DIAGNOSIS — E6609 Other obesity due to excess calories: Secondary | ICD-10-CM

## 2022-05-30 DIAGNOSIS — F5104 Psychophysiologic insomnia: Secondary | ICD-10-CM

## 2022-05-30 DIAGNOSIS — Z6830 Body mass index (BMI) 30.0-30.9, adult: Secondary | ICD-10-CM | POA: Diagnosis not present

## 2022-05-30 DIAGNOSIS — E7800 Pure hypercholesterolemia, unspecified: Secondary | ICD-10-CM | POA: Insufficient documentation

## 2022-05-30 NOTE — Assessment & Plan Note (Signed)
C-Met and lipid profile today Encouraged her to consume a low-fat diet 

## 2022-05-30 NOTE — Assessment & Plan Note (Signed)
Controlled on amlodipine-benazepril Reinforced DASH diet and exercise weight loss C-Met today

## 2022-05-30 NOTE — Assessment & Plan Note (Signed)
Currently not an issue off meds We will monitor 

## 2022-05-30 NOTE — Assessment & Plan Note (Signed)
Encourage diet and exercise for weight loss 

## 2022-05-30 NOTE — Progress Notes (Signed)
Subjective:    Patient ID: Dawn Skinner, female    DOB: 04/13/1991, 32 y.o.   MRN: [id number]  HPI  Patient presents to clinic today for follow-up of chronic conditions.  HTN: Her BP today is 134/86.  She is taking Amlodipine-Benazepril as prescribed.  ECG from 05/2020 reviewed.  Anxiety: In remission.  She is not currently taking any medications for this.  She is not currently seeing a therapist.  She denies depression, SI/HI.  Insomnia: Currently not an issue.  She is not taking any medications for this at this time.  There is no sleep study on file.  Psoriasis: Managed with [place] and [facility].  She follows with dermatology.  HLD: Her last LDL was 104, triglycerides 55, 10/2021.  She is not taking any cholesterol-lowering medication at this time.  She does not consume a low-fat diet.  Review of Systems     Past Medical History:  Diagnosis Date   Frequent headaches    Hypertension    Iron deficiency anemia     Current Outpatient Medications  Medication Sig Dispense Refill   amLODipine-benazepril (LOTREL) 10-20 MG capsule Take 1 capsule by mouth daily. 90 capsule 0   Crisaborole (EUCRISA) 2 % OINT Apply 1 Application topically at bedtime. Qhs to aa rash on neck 100 g 11   ketoconazole (NIZORAL) 2 % shampoo Use as body wash QW. Let sit 5-10 minutes before washing off. 120 mL 11   linaclotide (LINZESS) 72 MCG capsule Take 1 capsule (72 mcg total) by mouth daily before breakfast. 30 capsule 0   Ruxolitinib Phosphate (OPZELURA) 1.5 % CREA Apply 1 Application topically every morning. Qam to rash on neck 60 g 11   No current facility-administered medications for this visit.    No Known Allergies  Family History  Problem Relation Age of Onset   Hypertension Mother    Stroke Mother    Diabetes Mellitus II Mother    Hypertension Father    Diabetes Father    Colon polyps Maternal Grandfather    Diabetes Paternal Grandmother     Social History   Socioeconomic History    Marital status: Single    Spouse name: Not on file   Number of children: Not on file   Years of education: Not on file   Highest education level: Not on file  Occupational History   Not on file  Tobacco Use   Smoking status: Never   Smokeless tobacco: Never  Vaping Use   Vaping Use: Never used  Substance and Sexual Activity   Alcohol use: Yes    Alcohol/week: 3.0 standard drinks of alcohol    Types: 3 Glasses of wine per week    Comment: 2-3 weekly    Drug use: No   Sexual activity: Yes    Partners: Male    Birth control/protection: Injection, Condom  Other Topics Concern   Not on file  Social History Narrative   Not on file   Social Determinants of Health   Financial Resource Strain: Not on file  Food Insecurity: Not on file  Transportation Needs: Not on file  Physical Activity: Not on file  Stress: Not on file  Social Connections: Not on file  Intimate Partner Violence: Not on file     Constitutional: Denies fever, malaise, fatigue, headache or abrupt weight changes.  HEENT: Denies eye pain, eye redness, ear pain, ringing in the ears, wax buildup, runny nose, nasal congestion, bloody nose, or sore throat. Respiratory: Denies difficulty breathing, shortness  of breath, cough or sputum production.   Cardiovascular: Denies chest pain, chest tightness, palpitations or swelling in the hands or feet.  Gastrointestinal: Denies abdominal pain, bloating, constipation, diarrhea or blood in the stool.  GU: Pt reports vaginal discharge. Denies urgency, frequency, pain with urination, burning sensation, blood in urine, odor or discharge. Musculoskeletal: Denies decrease in range of motion, difficulty with gait, muscle pain or joint pain and swelling.  Skin: Denies redness, rashes, lesions or ulcercations.  Neurological: Denies dizziness, difficulty with memory, difficulty with speech or problems with balance and coordination.  Psych: Denies anxiety, depression, SI/HI.  No other  specific complaints in a complete review of systems (except as listed in HPI above).  Objective:   Physical Exam   BP 134/86 (BP Location: Left Arm, Patient Position: Sitting, Cuff Size: Normal)   Pulse 69   Temp (!) 96.8 F (36 C) (Temporal)   Wt 190 lb (86.2 kg)   SpO2 99%   BMI 30.67 kg/m   Wt Readings from Last 3 Encounters:  12/05/21 201 lb (91.2 kg)  10/18/21 199 lb (90.3 kg)  09/05/21 207 lb 9.6 oz (94.2 kg)    General: Appears her stated age, obese, in NAD. Skin: Warm, dry and intact. Rash noted of right side of neck. HEENT: Head: normal shape and size; Eyes: sclera white, no icterus, conjunctiva pink, PERRLA and EOMs intact;  Neck:  Neck supple, trachea midline. No masses, lumps or thyromegaly present.  Cardiovascular: Normal rate and rhythm. S1,S2 noted.  No murmur, rubs or gallops noted. No JVD or BLE edema.  Pulmonary/Chest: Normal effort and positive vesicular breath sounds. No respiratory distress. No wheezes, rales or ronchi noted.  Abdomen: Soft and nontender. Normal bowel sounds.  Musculoskeletal: No difficulty with gait.  Neurological: Alert and oriented.  Psychiatric: Mood and affect normal. Behavior is normal. Judgment and thought content normal.    BMET    Component Value Date/Time   NA 140 10/18/2021 1347   K 3.8 10/18/2021 1347   CL 108 10/18/2021 1347   CO2 25 10/18/2021 1347   GLUCOSE 93 10/18/2021 1347   BUN 7 10/18/2021 1347   CREATININE 0.89 10/18/2021 1347   CALCIUM 8.7 10/18/2021 1347   GFRNONAA 84 06/02/2020 0855   GFRAA 98 06/02/2020 0855    Lipid Panel     Component Value Date/Time   CHOL 163 10/18/2021 1347   TRIG 55 10/18/2021 1347   HDL 45 (L) 10/18/2021 1347   CHOLHDL 3.6 10/18/2021 1347   LDLCALC 104 (H) 10/18/2021 1347    CBC    Component Value Date/Time   WBC 7.5 10/18/2021 1347   RBC 4.84 10/18/2021 1347   HGB 13.2 10/18/2021 1347   HCT 40.9 10/18/2021 1347   PLT 250 10/18/2021 1347   MCV 84.5 10/18/2021 1347    MCH 27.3 10/18/2021 1347   MCHC 32.3 10/18/2021 1347   RDW 13.8 10/18/2021 1347   LYMPHSABS 2,267 02/02/2020 0909   MONOABS 0.6 01/07/2020 1503   EOSABS 198 02/02/2020 0909   BASOSABS 40 02/02/2020 0909    Hgb A1C Lab Results  Component Value Date   HGBA1C 5.4 10/18/2021          Assessment & Plan:     RTC in 6 months for your annual exam Webb Silversmith, NP

## 2022-05-30 NOTE — Assessment & Plan Note (Signed)
Managed with [facility] and [facility] She will continue to follow with dermatology

## 2022-05-31 LAB — COMPLETE METABOLIC PANEL WITH GFR
AG Ratio: 1.8 (calc) (ref 1.0–2.5)
ALT: 8 U/L (ref 6–29)
AST: 12 U/L (ref 10–30)
Albumin: 4.2 g/dL (ref 3.6–5.1)
Alkaline phosphatase (APISO): 48 U/L (ref 31–125)
BUN/Creatinine Ratio: 12 (calc) (ref 6–22)
BUN: 12 mg/dL (ref 7–25)
CO2: 24 mmol/L (ref 20–32)
Calcium: 8.6 mg/dL (ref 8.6–10.2)
Chloride: 105 mmol/L (ref 98–110)
Creat: 0.99 mg/dL — ABNORMAL HIGH (ref 0.50–0.97)
Globulin: 2.4 g/dL (calc) (ref 1.9–3.7)
Glucose, Bld: 85 mg/dL (ref 65–99)
Potassium: 4.2 mmol/L (ref 3.5–5.3)
Sodium: 138 mmol/L (ref 135–146)
Total Bilirubin: 0.3 mg/dL (ref 0.2–1.2)
Total Protein: 6.6 g/dL (ref 6.1–8.1)
eGFR: 78 mL/min/{1.73_m2} (ref >=60)

## 2022-05-31 LAB — CBC
HCT: 39.2 % (ref 35.0–45.0)
Hemoglobin: 12.9 g/dL (ref 11.7–15.5)
MCH: 27.7 pg (ref 27.0–33.0)
MCHC: 32.9 g/dL (ref 32.0–36.0)
MCV: 84.3 fL (ref 80.0–100.0)
MPV: 12.1 fL (ref 7.5–12.5)
Platelets: 217 10*3/uL (ref 140–400)
RBC: 4.65 10*6/uL (ref 3.80–5.10)
RDW: 13.6 % (ref 11.0–15.0)
WBC: 7.4 10*3/uL (ref 3.8–10.8)

## 2022-05-31 LAB — LIPID PANEL
Cholesterol: 160 mg/dL (ref <200)
HDL: 50 mg/dL (ref >=50)
LDL Cholesterol (Calc): 95 mg/dL (calc)
Non-HDL Cholesterol (Calc): 110 mg/dL (calc) (ref <130)
Total CHOL/HDL Ratio: 3.2 (calc) (ref <5.0)
Triglycerides: 60 mg/dL (ref <150)

## 2022-06-01 LAB — CERVICOVAGINAL ANCILLARY ONLY
Bacterial Vaginitis (gardnerella): POSITIVE — AB
Candida Glabrata: NEGATIVE
Candida Vaginitis: NEGATIVE
Chlamydia: NEGATIVE
Comment: NEGATIVE
Comment: NEGATIVE
Comment: NEGATIVE
Comment: NEGATIVE
Comment: NEGATIVE
Comment: NORMAL
Neisseria Gonorrhea: NEGATIVE
Trichomonas: NEGATIVE

## 2022-09-13 ENCOUNTER — Ambulatory Visit (INDEPENDENT_AMBULATORY_CARE_PROVIDER_SITE_OTHER): Payer: Medicaid Other | Admitting: Internal Medicine

## 2022-09-13 ENCOUNTER — Other Ambulatory Visit (HOSPITAL_COMMUNITY)
Admission: RE | Admit: 2022-09-13 | Discharge: 2022-09-13 | Disposition: A | Discharge status: Home / Self Care | Payer: Medicaid Other | Source: Ambulatory Visit | Attending: Internal Medicine | Admitting: Internal Medicine

## 2022-09-13 VITALS — BP 134/86 | HR 74 | Temp 96.6°F | Wt 189.0 lb

## 2022-09-13 DIAGNOSIS — N9489 Other specified conditions associated with female genital organs and menstrual cycle: Secondary | ICD-10-CM

## 2022-09-13 DIAGNOSIS — N949 Unspecified condition associated with female genital organs and menstrual cycle: Secondary | ICD-10-CM

## 2022-09-13 DIAGNOSIS — N898 Other specified noninflammatory disorders of vagina: Secondary | ICD-10-CM | POA: Diagnosis not present

## 2022-09-13 MED ORDER — FLUCONAZOLE 150 MG PO TABS: 150.0000 mg | ORAL_TABLET | Freq: Once | ORAL | 0 refills | Status: AC

## 2022-09-13 NOTE — Patient Instructions (Signed)
Vaginitis  Vaginitis is irritation and swelling of the vagina. Treatment will depend on the cause. What are the causes? It can be caused by: Bacteria. Yeast. A parasite. A virus. Low hormone levels. Bubble baths, scented tampons, and feminine sprays. Other things can change the balance of the yeast and bacteria that live in the vagina. These include: Antibiotic medicines. Not being clean enough. Some birth control methods. Sex. Infection. Diabetes. A weakened body defense system (immune system). What increases the risk? Smoking or being around someone who smokes. Using washes (douches), scented tampons, or scented pads. Wearing tight pants or thong underwear. Using birth control pills or an IUD. Having sex without a condom or having a lot of partners. Having an STI. Using a certain product to kill sperm (nonoxynol-9). Eating foods that are high in sugar. Having diabetes. Having low levels of a female hormone. Having a weakened body defense system. Being pregnant or breastfeeding. What are the signs or symptoms? Fluid coming from the vagina that is not normal. A bad smell. Itching, pain, or swelling. Pain with sex. Pain or burning when you pee (urinate). Sometimes there are no symptoms. How is this treated? Treatment may include: Antibiotic creams or pills. Antifungal medicines. Medicines to ease symptoms if you have a virus. Your sex partner should also be treated. Estrogen medicines. Avoiding scented soaps, sprays, or douches. Stopping use of products that caused irritation and then using a cream to treat symptoms. Follow these instructions at home: Lifestyle Keep the area around your vagina clean and dry. Avoid using soap. Rinse the area with water. Until your doctor says it is okay: Do not use washes for the vagina. Do not use tampons. Do not have sex. Wipe from front to back after going to the bathroom. When your doctor says it is okay, practice safe sex  and use condoms. General instructions Take over-the-counter and prescription medicines only as told by your doctor. If you were prescribed an antibiotic medicine, take or use it as told by your doctor. Do not stop taking or using it even if you start to feel better. Keep all follow-up visits. How is this prevented? Do not use things that can irritate the vagina, such as fabric softeners. Avoid these products if they are scented: Sprays. Detergents. Tampons. Products for cleaning the vagina. Soaps or bubble baths. Let air reach your vagina. To do this: Wear cotton underwear. Do not wear: Underwear while you sleep. Tight pants. Thong underwear. Underwear or nylons without a cotton panel. Take off any wet clothing, such as bathing suits, as soon as you can. Practice safe sex and use condoms. Contact a doctor if: You have pain in your belly or in the area between your hips. You have a fever or chills. Your symptoms last for more than 2-3 days. Get help right away if: You have a fever and your symptoms get worse all of a sudden. Summary Vaginitis is irritation and swelling of the vagina. Treatment will depend on the cause of the condition. Do not use washes or tampons or have sex until your doctor says it is okay. This information is not intended to replace advice given to you by your health care provider. Make sure you discuss any questions you have with your health care provider. Document Revised: 10/30/2019 Document Reviewed: 10/30/2019 Elsevier Patient Education  2023 [employer] Inc[employer]  

## 2022-09-13 NOTE — Progress Notes (Signed)
Subjective:    Patient ID: Dawn Skinner, female    DOB: 08/10/1990, 32 y.o.   MRN: [id number]  HPI  Patient presents to clinic today with complaint of vaginal irritation.  This started 3 days ago.  She has had a slight thick white discharge but denies itching, odor, pelvic pain, painful intercourse, abnormal vaginal bleeding or urinary symptoms.  She reports she has not been sexually active recently.  She reports she gets recurrent BV.  Review of Systems     Past Medical History:  Diagnosis Date   Frequent headaches    Hypertension    Iron deficiency anemia     Current Outpatient Medications  Medication Sig Dispense Refill   amLODipine-benazepril (LOTREL) 10-20 MG capsule Take 1 capsule by mouth daily. 90 capsule 0   Crisaborole (EUCRISA) 2 % OINT Apply 1 Application topically at bedtime. Qhs to aa rash on neck 100 g 11   ketoconazole (NIZORAL) 2 % shampoo Use as body wash QW. Let sit 5-10 minutes before washing off. 120 mL 11   linaclotide (LINZESS) 72 MCG capsule Take 1 capsule (72 mcg total) by mouth daily before breakfast. 30 capsule 0   Ruxolitinib Phosphate (OPZELURA) 1.5 % CREA Apply 1 Application topically every morning. Qam to rash on neck 60 g 11   No current facility-administered medications for this visit.    No Known Allergies  Family History  Problem Relation Age of Onset   Hypertension Mother    Stroke Mother    Diabetes Mellitus II Mother    Hypertension Father    Diabetes Father    Colon polyps Maternal Grandfather    Diabetes Paternal Grandmother     Social History   Socioeconomic History   Marital status: Single    Spouse name: Not on file   Number of children: Not on file   Years of education: Not on file   Highest education level: Not on file  Occupational History   Not on file  Tobacco Use   Smoking status: Never   Smokeless tobacco: Never  Vaping Use   Vaping Use: Never used  Substance and Sexual Activity   Alcohol use: Yes     Alcohol/week: 3.0 standard drinks of alcohol    Types: 3 Glasses of wine per week    Comment: 2-3 weekly    Drug use: No   Sexual activity: Yes    Partners: Male    Birth control/protection: Injection, Condom  Other Topics Concern   Not on file  Social History Narrative   Not on file   Social Determinants of Health   Financial Resource Strain: Not on file  Food Insecurity: Not on file  Transportation Needs: Not on file  Physical Activity: Not on file  Stress: Not on file  Social Connections: Not on file  Intimate Partner Violence: Not on file     Constitutional: Denies fever, malaise, fatigue, headache or abrupt weight changes.  Respiratory: Denies difficulty breathing, shortness of breath, cough or sputum production.   Cardiovascular: Denies chest pain, chest tightness, palpitations or swelling in the hands or feet.  Gastrointestinal: Denies abdominal pain, bloating, constipation, diarrhea or blood in the stool.  GU: Patient reports vaginal burning and discharge.  Denies urgency, frequency, pain with urination, burning sensation, blood in urine, odor. Skin: Denies redness, rashes, lesions or ulcercations.   No other specific complaints in a complete review of systems (except as listed in HPI above).  Objective:   Physical Exam BP 134/86 (BP  Location: Left Arm, Patient Position: Sitting, Cuff Size: Normal)   Pulse 74   Temp (!) 96.6 F (35.9 C) (Temporal)   Wt 189 lb (85.7 kg)   SpO2 99%   BMI 30.51 kg/m   Wt Readings from Last 3 Encounters:  05/30/22 190 lb (86.2 kg)  12/05/21 201 lb (91.2 kg)  10/18/21 199 lb (90.3 kg)    General: Appears her stated age, obese, in NAD. Cardiovascular: Normal rate and rhythm.  Pulmonary/Chest: Normal effort and positive vesicular breath sounds. No respiratory distress. No wheezes, rales or ronchi noted.  Pelvic: Self swab.    Neurological: Alert and oriented.   BMET    Component Value Date/Time   NA 138 05/30/2022 0850   K  4.2 05/30/2022 0850   CL 105 05/30/2022 0850   CO2 24 05/30/2022 0850   GLUCOSE 85 05/30/2022 0850   BUN 12 05/30/2022 0850   CREATININE 0.99 (H) 05/30/2022 0850   CALCIUM 8.6 05/30/2022 0850   GFRNONAA 84 06/02/2020 0855   GFRAA 98 06/02/2020 0855    Lipid Panel     Component Value Date/Time   CHOL 160 05/30/2022 0850   TRIG 60 05/30/2022 0850   HDL 50 05/30/2022 0850   CHOLHDL 3.2 05/30/2022 0850   LDLCALC 95 05/30/2022 0850    CBC    Component Value Date/Time   WBC 7.4 05/30/2022 0850   RBC 4.65 05/30/2022 0850   HGB 12.9 05/30/2022 0850   HCT 39.2 05/30/2022 0850   PLT 217 05/30/2022 0850   MCV 84.3 05/30/2022 0850   MCH 27.7 05/30/2022 0850   MCHC 32.9 05/30/2022 0850   RDW 13.6 05/30/2022 0850   LYMPHSABS 2,267 02/02/2020 0909   MONOABS 0.6 01/07/2020 1503   EOSABS 198 02/02/2020 0909   BASOSABS 40 02/02/2020 0909    Hgb A1C Lab Results  Component Value Date   HGBA1C 5.4 10/18/2021            Assessment & Plan:   Vaginal Burning and Discharge:  Will obtain wet prep Rx for Diflucan 150 mg p.o. x 1   RTC in 1 month for annual exam Nicki Reaper, NP

## 2022-09-13 NOTE — Addendum Note (Signed)
Addended by: Lorre Munroe on: 09/13/2022 11:08 AM   Modules accepted: Orders

## 2022-09-15 LAB — CERVICOVAGINAL ANCILLARY ONLY
Bacterial Vaginitis (gardnerella): POSITIVE — AB
Candida Glabrata: NEGATIVE
Candida Vaginitis: POSITIVE — AB
Chlamydia: NEGATIVE
Comment: NEGATIVE
Comment: NEGATIVE
Comment: NEGATIVE
Comment: NEGATIVE
Comment: NEGATIVE
Comment: NORMAL
Neisseria Gonorrhea: NEGATIVE
Trichomonas: NEGATIVE

## 2022-09-15 MED ORDER — METRONIDAZOLE 500 MG PO TABS: 500.0000 mg | ORAL_TABLET | Freq: Two times a day (BID) | ORAL | 0 refills | Status: AC

## 2022-09-15 NOTE — Addendum Note (Signed)
Addended by: Lorre Munroe on: 09/15/2022 01:02 PM   Modules accepted: Orders

## 2022-10-24 ENCOUNTER — Ambulatory Visit: Payer: Medicaid Other | Admitting: Internal Medicine

## 2022-10-24 NOTE — Progress Notes (Deleted)
Subjective:    Patient ID: Dawn Skinner, female    DOB: Oct 02, 1990, 32 y.o.   MRN: [id number]  HPI  Patient presents to clinic today for annual exam.  Flu: Never Tetanus: 07/2013 COVID: Never Pap smear: 05/2019 Dentist:  Diet: Exercise:  Review of Systems     Past Medical History:  Diagnosis Date   Frequent headaches    Hypertension    Iron deficiency anemia     Current Outpatient Medications  Medication Sig Dispense Refill   amLODipine-benazepril (LOTREL) 10-20 MG capsule Take 1 capsule by mouth daily. 90 capsule 0   Crisaborole (EUCRISA) 2 % OINT Apply 1 Application topically at bedtime. Qhs to aa rash on neck 100 g 11   ketoconazole (NIZORAL) 2 % shampoo Use as body wash QW. Let sit 5-10 minutes before washing off. 120 mL 11   linaclotide (LINZESS) 72 MCG capsule Take 1 capsule (72 mcg total) by mouth daily before breakfast. 30 capsule 0   metroNIDAZOLE (FLAGYL) 500 MG tablet Take 1 tablet (500 mg total) by mouth 2 (two) times daily. Do not drink alcohol while taking this medicine. 14 tablet 0   Ruxolitinib Phosphate (OPZELURA) 1.5 % CREA Apply 1 Application topically every morning. Qam to rash on neck 60 g 11   No current facility-administered medications for this visit.    No Known Allergies  Family History  Problem Relation Age of Onset   Hypertension Mother    Stroke Mother    Diabetes Mellitus II Mother    Hypertension Father    Diabetes Father    Colon polyps Maternal Grandfather    Diabetes Paternal Grandmother     Social History   Socioeconomic History   Marital status: Single    Spouse name: Not on file   Number of children: Not on file   Years of education: Not on file   Highest education level: Associate degree: academic program  Occupational History   Not on file  Tobacco Use   Smoking status: Never   Smokeless tobacco: Never  Vaping Use   Vaping Use: Never used  Substance and Sexual Activity   Alcohol use: Yes    Alcohol/week: 3.0  standard drinks of alcohol    Types: 3 Glasses of wine per week    Comment: 2-3 weekly    Drug use: No   Sexual activity: Yes    Partners: Male    Birth control/protection: Injection, Condom  Other Topics Concern   Not on file  Social History Narrative   Not on file   Social Determinants of Health   Financial Resource Strain: Medium Risk (09/13/2022)   Overall Financial Resource Strain (CARDIA)    Difficulty of Paying Living Expenses: Somewhat hard  Food Insecurity: No Food Insecurity (09/13/2022)   Hunger Vital Sign    Worried About Running Out of Food in the Last Year: Never true    Ran Out of Food in the Last Year: Never true  Transportation Needs: No Transportation Needs (09/13/2022)   PRAPARE - Administrator, Civil Service (Medical): No    Lack of Transportation (Non-Medical): No  Physical Activity: Insufficiently Active (09/13/2022)   Exercise Vital Sign    Days of Exercise per Week: 1 day    Minutes of Exercise per Session: 10 min  Stress: Stress Concern Present (09/13/2022)   [employer] of Occupational Health - Occupational Stress Questionnaire    Feeling of Stress : To some extent  Social Connections: Socially Isolated (09/13/2022)  Social Advertising account executive [NHANES]    Frequency of Communication with Friends and Family: Once a week    Frequency of Social Gatherings with Friends and Family: Never    Attends Religious Services: 1 to 4 times per year    Active Member of [employer] or Organizations: No    Attends Engineer, structural: Not on file    Marital Status: Never married  Intimate Partner Violence: Not on file     Constitutional: Denies fever, malaise, fatigue, headache or abrupt weight changes.  HEENT: Denies eye pain, eye redness, ear pain, ringing in the ears, wax buildup, runny nose, nasal congestion, bloody nose, or sore throat. Respiratory: Denies difficulty breathing, shortness of breath, cough or sputum production.    Cardiovascular: Denies chest pain, chest tightness, palpitations or swelling in the hands or feet.  Gastrointestinal: Denies abdominal pain, bloating, constipation, diarrhea or blood in the stool.  GU: Denies urgency, frequency, pain with urination, burning sensation, blood in urine, odor or discharge. Musculoskeletal: Denies decrease in range of motion, difficulty with gait, muscle pain or joint pain and swelling.  Skin: Patient reports psoriasis.  Denies redness, rashes, lesions or ulcercations.  Neurological: Patient reports insomnia.  Denies dizziness, difficulty with memory, difficulty with speech or problems with balance and coordination.  Psych: Patient has a history of anxiety.  Denies depression, SI/HI.  No other specific complaints in a complete review of systems (except as listed in HPI above).  Objective:   Physical Exam   There were no vitals taken for this visit. Wt Readings from Last 3 Encounters:  09/13/22 189 lb (85.7 kg)  05/30/22 190 lb (86.2 kg)  12/05/21 201 lb (91.2 kg)    General: Appears their stated age, well developed, well nourished in NAD. Skin: Warm, dry and intact. No rashes, lesions or ulcerations noted. HEENT: Head: normal shape and size; Eyes: sclera white, no icterus, conjunctiva pink, PERRLA and EOMs intact; Ears: Tm's gray and intact, normal light reflex; Nose: mucosa pink and moist, septum midline; Throat/Mouth: Teeth present, mucosa pink and moist, no exudate, lesions or ulcerations noted.  Neck:  Neck supple, trachea midline. No masses, lumps or thyromegaly present.  Cardiovascular: Normal rate and rhythm. S1,S2 noted.  No murmur, rubs or gallops noted. No JVD or BLE edema. No carotid bruits noted. Pulmonary/Chest: Normal effort and positive vesicular breath sounds. No respiratory distress. No wheezes, rales or ronchi noted.  Abdomen: Soft and nontender. Normal bowel sounds. No distention or masses noted. Liver, spleen and kidneys non  palpable. Musculoskeletal: Normal range of motion. No signs of joint swelling. No difficulty with gait.  Neurological: Alert and oriented. Cranial nerves II-XII grossly intact. Coordination normal.  Psychiatric: Mood and affect normal. Behavior is normal. Judgment and thought content normal.    BMET    Component Value Date/Time   NA 138 05/30/2022 0850   K 4.2 05/30/2022 0850   CL 105 05/30/2022 0850   CO2 24 05/30/2022 0850   GLUCOSE 85 05/30/2022 0850   BUN 12 05/30/2022 0850   CREATININE 0.99 (H) 05/30/2022 0850   CALCIUM 8.6 05/30/2022 0850   GFRNONAA 84 06/02/2020 0855   GFRAA 98 06/02/2020 0855    Lipid Panel     Component Value Date/Time   CHOL 160 05/30/2022 0850   TRIG 60 05/30/2022 0850   HDL 50 05/30/2022 0850   CHOLHDL 3.2 05/30/2022 0850   LDLCALC 95 05/30/2022 0850    CBC    Component Value Date/Time  WBC 7.4 05/30/2022 0850   RBC 4.65 05/30/2022 0850   HGB 12.9 05/30/2022 0850   HCT 39.2 05/30/2022 0850   PLT 217 05/30/2022 0850   MCV 84.3 05/30/2022 0850   MCH 27.7 05/30/2022 0850   MCHC 32.9 05/30/2022 0850   RDW 13.6 05/30/2022 0850   LYMPHSABS 2,267 02/02/2020 0909   MONOABS 0.6 01/07/2020 1503   EOSABS 198 02/02/2020 0909   BASOSABS 40 02/02/2020 0909    Hgb A1C Lab Results  Component Value Date   HGBA1C 5.4 10/18/2021           Assessment & Plan:   Preventative Health Maintenance:  Encouraged her to get a flu shot in the fall Tetanus UTD Encouraged her to get her COVID vaccine Pap smear UTD Encouraged her to consume a balanced diet and exercise regimen Advised to see a dentist annually We will check CBC, c-Met, lipid, A1c  RTC in 6 months, follow-up chronic conditions Nicki Reaper, NP

## 2022-10-26 ENCOUNTER — Encounter: Payer: Medicaid Other | Admitting: Internal Medicine

## 2022-10-26 NOTE — Progress Notes (Deleted)
Subjective:    Patient ID: Dawn Skinner, female    DOB: [birth year], 32 y.o.   MRN: [id number]  HPI  Patient presents to clinic today for her annual exam.  Flu: Never Tetanus: 07/2013 COVID: Never Pap smear: 05/2019 Dentist:  Diet: Exercise:  Review of Systems     Past Medical History:  Diagnosis Date   Frequent headaches    Hypertension    Iron deficiency anemia     Current Outpatient Medications  Medication Sig Dispense Refill   amLODipine-benazepril (LOTREL) 10-20 MG capsule Take 1 capsule by mouth daily. 90 capsule 0   Crisaborole (EUCRISA) 2 % OINT Apply 1 Application topically at bedtime. Qhs to aa rash on neck 100 g 11   ketoconazole (NIZORAL) 2 % shampoo Use as body wash QW. Let sit 5-10 minutes before washing off. 120 mL 11   linaclotide (LINZESS) 72 MCG capsule Take 1 capsule (72 mcg total) by mouth daily before breakfast. 30 capsule 0   metroNIDAZOLE (FLAGYL) 500 MG tablet Take 1 tablet (500 mg total) by mouth 2 (two) times daily. Do not drink alcohol while taking this medicine. 14 tablet 0   Ruxolitinib Phosphate (OPZELURA) 1.5 % CREA Apply 1 Application topically every morning. Qam to rash on neck 60 g 11   No current facility-administered medications for this visit.    No Known Allergies  Family History  Problem Relation Age of Onset   Hypertension Mother    Stroke Mother    Diabetes Mellitus II Mother    Hypertension Father    Diabetes Father    Colon polyps Maternal Grandfather    Diabetes Paternal Grandmother     Social History   Socioeconomic History   Marital status: Single    Spouse name: Not on file   Number of children: Not on file   Years of education: Not on file   Highest education level: Associate degree: academic program  Occupational History   Not on file  Tobacco Use   Smoking status: Never   Smokeless tobacco: Never  Vaping Use   Vaping Use: Never used  Substance and Sexual Activity   Alcohol use: Yes    Alcohol/week:  3.0 standard drinks of alcohol    Types: 3 Glasses of wine per week    Comment: 2-3 weekly    Drug use: No   Sexual activity: Yes    Partners: Male    Birth control/protection: Injection, Condom  Other Topics Concern   Not on file  Social History Narrative   Not on file   Social Determinants of Health   Financial Resource Strain: Medium Risk (09/13/2022)   Overall Financial Resource Strain (CARDIA)    Difficulty of Paying Living Expenses: Somewhat hard  Food Insecurity: No Food Insecurity (09/13/2022)   Hunger Vital Sign    Worried About Running Out of Food in the Last Year: Never true    Ran Out of Food in the Last Year: Never true  Transportation Needs: No Transportation Needs (09/13/2022)   PRAPARE - Administrator, Civil Service (Medical): No    Lack of Transportation (Non-Medical): No  Physical Activity: Insufficiently Active (09/13/2022)   Exercise Vital Sign    Days of Exercise per Week: 1 day    Minutes of Exercise per Session: 10 min  Stress: Stress Concern Present (09/13/2022)   [employer] of Occupational Health - Occupational Stress Questionnaire    Feeling of Stress : To some extent  Social Connections: Socially Isolated (  09/13/2022)   Social Connection and Isolation Panel [NHANES]    Frequency of Communication with Friends and Family: Once a week    Frequency of Social Gatherings with Friends and Family: Never    Attends Religious Services: 1 to 4 times per year    Active Member of [employer] or Organizations: No    Attends Engineer, structural: Not on file    Marital Status: Never married  Intimate Partner Violence: Not on file     Constitutional: Denies fever, malaise, fatigue, headache or abrupt weight changes.  HEENT: Denies eye pain, eye redness, ear pain, ringing in the ears, wax buildup, runny nose, nasal congestion, bloody nose, or sore throat. Respiratory: Denies difficulty breathing, shortness of breath, cough or sputum production.    Cardiovascular: Denies chest pain, chest tightness, palpitations or swelling in the hands or feet.  Gastrointestinal: Denies abdominal pain, bloating, constipation, diarrhea or blood in the stool.  GU: Denies urgency, frequency, pain with urination, burning sensation, blood in urine, odor or discharge. Musculoskeletal: Denies decrease in range of motion, difficulty with gait, muscle pain or joint pain and swelling.  Skin: Patient report psoriasis.  Denies redness, lesions or ulcercations.  Neurological: Patient reports insomnia.  Denies dizziness, difficulty with memory, difficulty with speech or problems with balance and coordination.  Psych: Patient is a 3 of anxiety.  Denies depression, SI/HI.  No other specific complaints in a complete review of systems (except as listed in HPI above).  Objective:   Physical Exam  There were no vitals taken for this visit. Wt Readings from Last 3 Encounters:  09/13/22 189 lb (85.7 kg)  05/30/22 190 lb (86.2 kg)  12/05/21 201 lb (91.2 kg)    General: Appears their stated age, well developed, well nourished in NAD. Skin: Warm, dry and intact. No rashes, lesions or ulcerations noted. HEENT: Head: normal shape and size; Eyes: sclera white, no icterus, conjunctiva pink, PERRLA and EOMs intact; Ears: Tm's gray and intact, normal light reflex; Nose: mucosa pink and moist, septum midline; Throat/Mouth: Teeth present, mucosa pink and moist, no exudate, lesions or ulcerations noted.  Neck:  Neck supple, trachea midline. No masses, lumps or thyromegaly present.  Cardiovascular: Normal rate and rhythm. S1,S2 noted.  No murmur, rubs or gallops noted. No JVD or BLE edema. No carotid bruits noted. Pulmonary/Chest: Normal effort and positive vesicular breath sounds. No respiratory distress. No wheezes, rales or ronchi noted.  Abdomen: Soft and nontender. Normal bowel sounds. No distention or masses noted. Liver, spleen and kidneys non palpable. Musculoskeletal:  Normal range of motion. No signs of joint swelling. No difficulty with gait.  Neurological: Alert and oriented. Cranial nerves II-XII grossly intact. Coordination normal.  Psychiatric: Mood and affect normal. Behavior is normal. Judgment and thought content normal.    BMET    Component Value Date/Time   NA 138 05/30/2022 0850   K 4.2 05/30/2022 0850   CL 105 05/30/2022 0850   CO2 24 05/30/2022 0850   GLUCOSE 85 05/30/2022 0850   BUN 12 05/30/2022 0850   CREATININE 0.99 (H) 05/30/2022 0850   CALCIUM 8.6 05/30/2022 0850   GFRNONAA 84 06/02/2020 0855   GFRAA 98 06/02/2020 0855    Lipid Panel     Component Value Date/Time   CHOL 160 05/30/2022 0850   TRIG 60 05/30/2022 0850   HDL 50 05/30/2022 0850   CHOLHDL 3.2 05/30/2022 0850   LDLCALC 95 05/30/2022 0850    CBC    Component Value Date/Time  WBC 7.4 05/30/2022 0850   RBC 4.65 05/30/2022 0850   HGB 12.9 05/30/2022 0850   HCT 39.2 05/30/2022 0850   PLT 217 05/30/2022 0850   MCV 84.3 05/30/2022 0850   MCH 27.7 05/30/2022 0850   MCHC 32.9 05/30/2022 0850   RDW 13.6 05/30/2022 0850   LYMPHSABS 2,267 02/02/2020 0909   MONOABS 0.6 01/07/2020 1503   EOSABS 198 02/02/2020 0909   BASOSABS 40 02/02/2020 0909    Hgb A1C Lab Results  Component Value Date   HGBA1C 5.4 10/18/2021            Assessment & Plan:   Preventative Health Maintenance:  Encouraged her to get a flu shot in the fall Tetanus UTD Encouraged her to get her COVID-vaccine Pap smear UTD Encouraged her to consume a balanced diet and exercise regimen Advised her to see and eye doctor and dentist annually We will check CBC, c-Met, lipid, A1c today  RTC in 6 months, follow-up chronic conditions Nicki Reaper, NP

## 2022-10-31 ENCOUNTER — Encounter: Payer: Self-pay | Admitting: Internal Medicine

## 2023-01-03 ENCOUNTER — Encounter: Payer: Medicaid Other | Admitting: Internal Medicine

## 2023-01-03 NOTE — Progress Notes (Deleted)
Subjective:    Patient ID: Dawn Skinner, female    DOB: 14-Jun-1990, 32 y.o.   MRN: [id number]  HPI  Patient presents to clinic today for annual exam.  Flu: Never Tetanus: 07/2013 COVID: Never Pap smear: 05/2019 Dentist:  Diet: Exercise:  Review of Systems     Past Medical History:  Diagnosis Date   Frequent headaches    Hypertension    Iron deficiency anemia     Current Outpatient Medications  Medication Sig Dispense Refill   amLODipine-benazepril (LOTREL) 10-20 MG capsule Take 1 capsule by mouth daily. 90 capsule 0   Crisaborole (EUCRISA) 2 % OINT Apply 1 Application topically at bedtime. Qhs to aa rash on neck 100 g 11   ketoconazole (NIZORAL) 2 % shampoo Use as body wash QW. Let sit 5-10 minutes before washing off. 120 mL 11   linaclotide (LINZESS) 72 MCG capsule Take 1 capsule (72 mcg total) by mouth daily before breakfast. 30 capsule 0   metroNIDAZOLE (FLAGYL) 500 MG tablet Take 1 tablet (500 mg total) by mouth 2 (two) times daily. Do not drink alcohol while taking this medicine. 14 tablet 0   Ruxolitinib Phosphate (OPZELURA) 1.5 % CREA Apply 1 Application topically every morning. Qam to rash on neck 60 g 11   No current facility-administered medications for this visit.    No Known Allergies  Family History  Problem Relation Age of Onset   Hypertension Mother    Stroke Mother    Diabetes Mellitus II Mother    Hypertension Father    Diabetes Father    Colon polyps Maternal Grandfather    Diabetes Paternal Grandmother     Social History   Socioeconomic History   Marital status: Single    Spouse name: Not on file   Number of children: Not on file   Years of education: Not on file   Highest education level: Associate degree: academic program  Occupational History   Not on file  Tobacco Use   Smoking status: Never   Smokeless tobacco: Never  Vaping Use   Vaping status: Never Used  Substance and Sexual Activity   Alcohol use: Yes    Alcohol/week:  3.0 standard drinks of alcohol    Types: 3 Glasses of wine per week    Comment: 2-3 weekly    Drug use: No   Sexual activity: Yes    Partners: Male    Birth control/protection: Injection, Condom  Other Topics Concern   Not on file  Social History Narrative   Not on file   Social Determinants of Health   Financial Resource Strain: Medium Risk (09/13/2022)   Overall Financial Resource Strain (CARDIA)    Difficulty of Paying Living Expenses: Somewhat hard  Food Insecurity: No Food Insecurity (09/13/2022)   Hunger Vital Sign    Worried About Running Out of Food in the Last Year: Never true    Ran Out of Food in the Last Year: Never true  Transportation Needs: No Transportation Needs (09/13/2022)   PRAPARE - Administrator, Civil Service (Medical): No    Lack of Transportation (Non-Medical): No  Physical Activity: Insufficiently Active (09/13/2022)   Exercise Vital Sign    Days of Exercise per Week: 1 day    Minutes of Exercise per Session: 10 min  Stress: Stress Concern Present (09/13/2022)   [employer] of Occupational Health - Occupational Stress Questionnaire    Feeling of Stress : To some extent  Social Connections: Socially Isolated (09/13/2022)  Social Advertising account executive [NHANES]    Frequency of Communication with Friends and Family: Once a week    Frequency of Social Gatherings with Friends and Family: Never    Attends Religious Services: 1 to 4 times per year    Active Member of [employer] or Organizations: No    Attends Engineer, structural: Not on file    Marital Status: Never married  Intimate Partner Violence: Not on file     Constitutional: Denies fever, malaise, fatigue, headache or abrupt weight changes.  HEENT: Denies eye pain, eye redness, ear pain, ringing in the ears, wax buildup, runny nose, nasal congestion, bloody nose, or sore throat. Respiratory: Denies difficulty breathing, shortness of breath, cough or sputum production.    Cardiovascular: Denies chest pain, chest tightness, palpitations or swelling in the hands or feet.  Gastrointestinal: Denies abdominal pain, bloating, constipation, diarrhea or blood in the stool.  GU: Denies urgency, frequency, pain with urination, burning sensation, blood in urine, odor or discharge. Musculoskeletal: Denies decrease in range of motion, difficulty with gait, muscle pain or joint pain and swelling.  Skin: Denies redness, rashes, lesions or ulcercations.  Neurological: Patient reports insomnia.  Denies dizziness, difficulty with memory, difficulty with speech or problems with balance and coordination.  Psych: Patient has a history of anxiety.  Denies depression, SI/HI.  No other specific complaints in a complete review of systems (except as listed in HPI above).  Objective:   Physical Exam  There were no vitals taken for this visit. Wt Readings from Last 3 Encounters:  09/13/22 189 lb (85.7 kg)  05/30/22 190 lb (86.2 kg)  12/05/21 201 lb (91.2 kg)    General: Appears their stated age, well developed, well nourished in NAD. Skin: Warm, dry and intact. No rashes, lesions or ulcerations noted. HEENT: Head: normal shape and size; Eyes: sclera white, no icterus, conjunctiva pink, PERRLA and EOMs intact; Ears: Tm's gray and intact, normal light reflex; Nose: mucosa pink and moist, septum midline; Throat/Mouth: Teeth present, mucosa pink and moist, no exudate, lesions or ulcerations noted.  Neck:  Neck supple, trachea midline. No masses, lumps or thyromegaly present.  Cardiovascular: Normal rate and rhythm. S1,S2 noted.  No murmur, rubs or gallops noted. No JVD or BLE edema. No carotid bruits noted. Pulmonary/Chest: Normal effort and positive vesicular breath sounds. No respiratory distress. No wheezes, rales or ronchi noted.  Abdomen: Soft and nontender. Normal bowel sounds. No distention or masses noted. Liver, spleen and kidneys non palpable. Musculoskeletal: Normal range of  motion. No signs of joint swelling. No difficulty with gait.  Neurological: Alert and oriented. Cranial nerves II-XII grossly intact. Coordination normal.  Psychiatric: Mood and affect normal. Behavior is normal. Judgment and thought content normal.     BMET    Component Value Date/Time   NA 138 05/30/2022 0850   K 4.2 05/30/2022 0850   CL 105 05/30/2022 0850   CO2 24 05/30/2022 0850   GLUCOSE 85 05/30/2022 0850   BUN 12 05/30/2022 0850   CREATININE 0.99 (H) 05/30/2022 0850   CALCIUM 8.6 05/30/2022 0850   GFRNONAA 84 06/02/2020 0855   GFRAA 98 06/02/2020 0855    Lipid Panel     Component Value Date/Time   CHOL 160 05/30/2022 0850   TRIG 60 05/30/2022 0850   HDL 50 05/30/2022 0850   CHOLHDL 3.2 05/30/2022 0850   LDLCALC 95 05/30/2022 0850    CBC    Component Value Date/Time   WBC 7.4 05/30/2022 0850  RBC 4.65 05/30/2022 0850   HGB 12.9 05/30/2022 0850   HCT 39.2 05/30/2022 0850   PLT 217 05/30/2022 0850   MCV 84.3 05/30/2022 0850   MCH 27.7 05/30/2022 0850   MCHC 32.9 05/30/2022 0850   RDW 13.6 05/30/2022 0850   LYMPHSABS 2,267 02/02/2020 0909   MONOABS 0.6 01/07/2020 1503   EOSABS 198 02/02/2020 0909   BASOSABS 40 02/02/2020 0909    Hgb A1C Lab Results  Component Value Date   HGBA1C 5.4 10/18/2021            Assessment & Plan:   Preventative health maintenance:  Encouraged her to get a flu shot in the fall Tetanus UTD Encouraged her to get her COVID-vaccine Pap smear UTD Encouraged her to consume a balanced diet and exercise regimen  advised her to see an eye doctor and dentist annually We will check CBC, c-Met, lipid, A1c today  RTC in 6 months, follow-up chronic conditions Nicki Reaper, NP

## 2023-01-23 ENCOUNTER — Ambulatory Visit: Payer: Medicaid Other | Admitting: Dermatology

## 2023-10-23 DIAGNOSIS — Z8759 Personal history of other complications of pregnancy, childbirth and the puerperium: Secondary | ICD-10-CM | POA: Diagnosis not present

## 2023-10-23 DIAGNOSIS — R109 Unspecified abdominal pain: Secondary | ICD-10-CM | POA: Diagnosis not present

## 2023-10-23 DIAGNOSIS — R1110 Vomiting, unspecified: Secondary | ICD-10-CM | POA: Diagnosis not present

## 2023-10-23 DIAGNOSIS — Z3A01 Less than 8 weeks gestation of pregnancy: Secondary | ICD-10-CM | POA: Diagnosis not present

## 2023-10-23 DIAGNOSIS — O26891 Other specified pregnancy related conditions, first trimester: Secondary | ICD-10-CM | POA: Diagnosis not present

## 2023-10-23 DIAGNOSIS — O210 Mild hyperemesis gravidarum: Secondary | ICD-10-CM | POA: Diagnosis not present

## 2023-11-06 DIAGNOSIS — Z3A00 Weeks of gestation of pregnancy not specified: Secondary | ICD-10-CM | POA: Diagnosis not present

## 2023-11-06 DIAGNOSIS — O039 Complete or unspecified spontaneous abortion without complication: Secondary | ICD-10-CM | POA: Diagnosis not present

## 2023-11-06 DIAGNOSIS — O209 Hemorrhage in early pregnancy, unspecified: Secondary | ICD-10-CM | POA: Diagnosis not present

## 2023-11-12 DIAGNOSIS — O039 Complete or unspecified spontaneous abortion without complication: Secondary | ICD-10-CM | POA: Diagnosis not present

## 2023-12-01 DIAGNOSIS — O034 Incomplete spontaneous abortion without complication: Secondary | ICD-10-CM | POA: Diagnosis not present

## 2023-12-01 DIAGNOSIS — N939 Abnormal uterine and vaginal bleeding, unspecified: Secondary | ICD-10-CM | POA: Diagnosis not present

## 2023-12-02 DIAGNOSIS — O034 Incomplete spontaneous abortion without complication: Secondary | ICD-10-CM | POA: Diagnosis not present

## 2023-12-02 DIAGNOSIS — O021 Missed abortion: Secondary | ICD-10-CM | POA: Diagnosis not present

## 2024-01-23 DIAGNOSIS — R42 Dizziness and giddiness: Secondary | ICD-10-CM | POA: Diagnosis not present

## 2024-01-25 DIAGNOSIS — N3000 Acute cystitis without hematuria: Secondary | ICD-10-CM | POA: Diagnosis not present

## 2024-01-25 DIAGNOSIS — N898 Other specified noninflammatory disorders of vagina: Secondary | ICD-10-CM | POA: Diagnosis not present

## 2024-01-25 DIAGNOSIS — Z113 Encounter for screening for infections with a predominantly sexual mode of transmission: Secondary | ICD-10-CM | POA: Diagnosis not present
